# Patient Record
Sex: Male | Born: 1960 | Race: White | Hispanic: No | Marital: Single | Smoking: Former smoker
Health system: Southern US, Community
[De-identification: ages and names within clinical notes are randomized; demographics above are authoritative.]

## PROBLEM LIST (undated history)

## (undated) DIAGNOSIS — L4050 Arthropathic psoriasis, unspecified: Secondary | ICD-10-CM

## (undated) DIAGNOSIS — I10 Essential (primary) hypertension: Secondary | ICD-10-CM

## (undated) DIAGNOSIS — G894 Chronic pain syndrome: Secondary | ICD-10-CM

## (undated) HISTORY — PX: BACK SURGERY: SHX140

## (undated) HISTORY — PX: HAND SURGERY: SHX662

## (undated) HISTORY — PX: APPENDECTOMY: SHX54

---

## 2012-07-01 DIAGNOSIS — L4050 Arthropathic psoriasis, unspecified: Secondary | ICD-10-CM | POA: Insufficient documentation

## 2020-11-12 ENCOUNTER — Encounter (HOSPITAL_COMMUNITY): Payer: Self-pay | Admitting: Internal Medicine

## 2020-11-12 ENCOUNTER — Observation Stay (HOSPITAL_COMMUNITY)
Admission: EM | Admit: 2020-11-12 | Discharge: 2020-11-14 | Disposition: A | Discharge status: Home / Self Care | Payer: Medicare Other | Attending: Obstetrics and Gynecology | Admitting: Obstetrics and Gynecology

## 2020-11-12 ENCOUNTER — Observation Stay (HOSPITAL_COMMUNITY): Payer: Medicare Other

## 2020-11-12 ENCOUNTER — Other Ambulatory Visit: Payer: Self-pay

## 2020-11-12 ENCOUNTER — Emergency Department (HOSPITAL_COMMUNITY): Payer: Medicare Other

## 2020-11-12 DIAGNOSIS — Z87891 Personal history of nicotine dependence: Secondary | ICD-10-CM | POA: Diagnosis not present

## 2020-11-12 DIAGNOSIS — R29810 Facial weakness: Secondary | ICD-10-CM

## 2020-11-12 DIAGNOSIS — R202 Paresthesia of skin: Secondary | ICD-10-CM | POA: Diagnosis not present

## 2020-11-12 DIAGNOSIS — I10 Essential (primary) hypertension: Secondary | ICD-10-CM | POA: Diagnosis not present

## 2020-11-12 DIAGNOSIS — Z20822 Contact with and (suspected) exposure to covid-19: Secondary | ICD-10-CM | POA: Insufficient documentation

## 2020-11-12 DIAGNOSIS — G459 Transient cerebral ischemic attack, unspecified: Secondary | ICD-10-CM

## 2020-11-12 DIAGNOSIS — Z79899 Other long term (current) drug therapy: Secondary | ICD-10-CM | POA: Diagnosis not present

## 2020-11-12 DIAGNOSIS — I639 Cerebral infarction, unspecified: Secondary | ICD-10-CM | POA: Diagnosis not present

## 2020-11-12 DIAGNOSIS — R030 Elevated blood-pressure reading, without diagnosis of hypertension: Secondary | ICD-10-CM

## 2020-11-12 HISTORY — DX: Chronic pain syndrome: G89.4

## 2020-11-12 HISTORY — DX: Arthropathic psoriasis, unspecified: L40.50

## 2020-11-12 HISTORY — DX: Essential (primary) hypertension: I10

## 2020-11-12 LAB — CBG MONITORING, ED: Glucose-Capillary: 121 mg/dL — ABNORMAL HIGH (ref 70–99)

## 2020-11-12 LAB — I-STAT CHEM 8, ED
BUN: 6 mg/dL — ABNORMAL LOW (ref 8–23)
Calcium, Ion: 1.03 mmol/L — ABNORMAL LOW (ref 1.15–1.40)
Chloride: 104 mmol/L (ref 98–111)
Creatinine, Ser: 0.8 mg/dL (ref 0.61–1.24)
Glucose, Bld: 130 mg/dL — ABNORMAL HIGH (ref 70–99)
HCT: 38 % — ABNORMAL LOW (ref 39.0–52.0)
Hemoglobin: 12.9 g/dL — ABNORMAL LOW (ref 13.0–17.0)
Potassium: 3.8 mmol/L (ref 3.5–5.1)
Sodium: 136 mmol/L (ref 135–145)
TCO2: 22 mmol/L (ref 22–32)

## 2020-11-12 LAB — CBC
HCT: 39.3 % (ref 39.0–52.0)
Hemoglobin: 12.5 g/dL — ABNORMAL LOW (ref 13.0–17.0)
MCH: 27 pg (ref 26.0–34.0)
MCHC: 31.8 g/dL (ref 30.0–36.0)
MCV: 84.9 fL (ref 80.0–100.0)
Platelets: 348 10*3/uL (ref 150–400)
RBC: 4.63 MIL/uL (ref 4.22–5.81)
RDW: 15.8 % — ABNORMAL HIGH (ref 11.5–15.5)
WBC: 9.1 10*3/uL (ref 4.0–10.5)
nRBC: 0 % (ref 0.0–0.2)

## 2020-11-12 LAB — COMPREHENSIVE METABOLIC PANEL
ALT: 21 U/L (ref 0–44)
AST: 25 U/L (ref 15–41)
Albumin: 3.5 g/dL (ref 3.5–5.0)
Alkaline Phosphatase: 83 U/L (ref 38–126)
Anion gap: 11 (ref 5–15)
BUN: 5 mg/dL — ABNORMAL LOW (ref 8–23)
CO2: 21 mmol/L — ABNORMAL LOW (ref 22–32)
Calcium: 8.7 mg/dL — ABNORMAL LOW (ref 8.9–10.3)
Chloride: 102 mmol/L (ref 98–111)
Creatinine, Ser: 0.89 mg/dL (ref 0.61–1.24)
GFR, Estimated: >60 mL/min (ref >60)
Glucose, Bld: 128 mg/dL — ABNORMAL HIGH (ref 70–99)
Potassium: 3.7 mmol/L (ref 3.5–5.1)
Sodium: 134 mmol/L — ABNORMAL LOW (ref 135–145)
Total Bilirubin: 0.3 mg/dL (ref 0.3–1.2)
Total Protein: 7.8 g/dL (ref 6.5–8.1)

## 2020-11-12 LAB — APTT: aPTT: 28 seconds (ref 24–36)

## 2020-11-12 LAB — DIFFERENTIAL
Abs Immature Granulocytes: 0.04 10*3/uL (ref 0.00–0.07)
Basophils Absolute: 0.1 10*3/uL (ref 0.0–0.1)
Basophils Relative: 1 %
Eosinophils Absolute: 0.2 10*3/uL (ref 0.0–0.5)
Eosinophils Relative: 2 %
Immature Granulocytes: 0 %
Lymphocytes Relative: 33 %
Lymphs Abs: 3 10*3/uL (ref 0.7–4.0)
Monocytes Absolute: 0.5 10*3/uL (ref 0.1–1.0)
Monocytes Relative: 5 %
Neutro Abs: 5.3 10*3/uL (ref 1.7–7.7)
Neutrophils Relative %: 59 %

## 2020-11-12 LAB — PROTIME-INR
INR: 1 (ref 0.8–1.2)
Prothrombin Time: 13.1 seconds (ref 11.4–15.2)

## 2020-11-12 IMAGING — CT CT HEAD CODE STROKE
3 series · 16 of 37 positions shown, 18 images · non-contrast
Comparison: None.

CLINICAL DATA: Code stroke.  Left facial droop and numbness

EXAM:
CT HEAD WITHOUT CONTRAST
TECHNIQUE: Contiguous axial images were obtained from the base of the skull
through the vertex without intravenous contrast.

[Series 3: head without · axial · non-contrast · 0.46mm/px · z∈[+1274,+1404]mm · 7 of 36 slices shown, 9 images]
[im 5/36  brain]
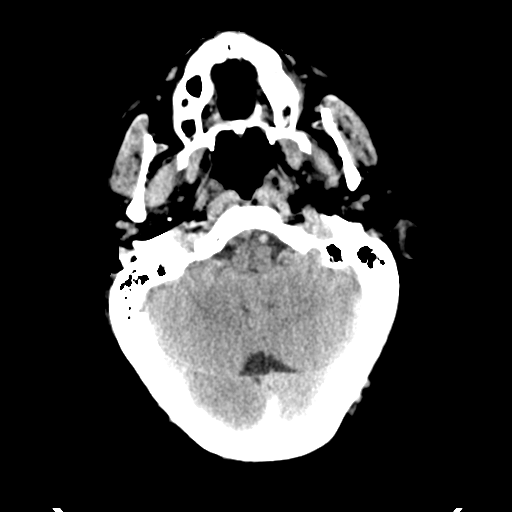
[im 5/36  bone]
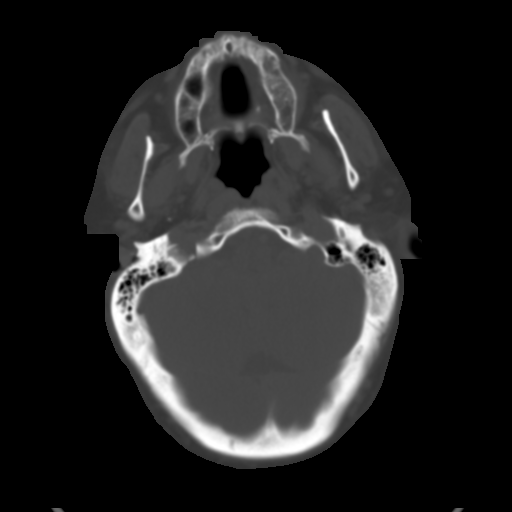
[im 9/36  brain]
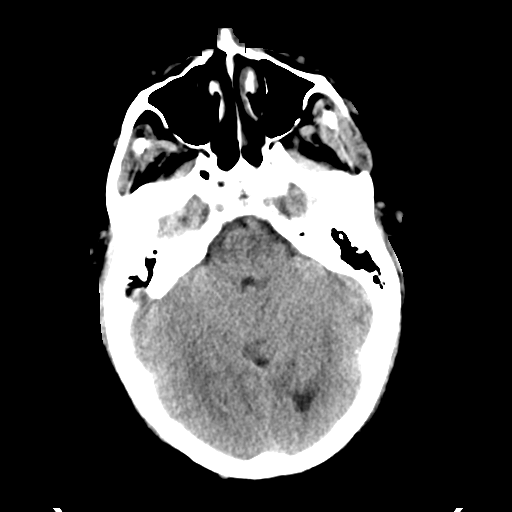
[im 14/36  brain]
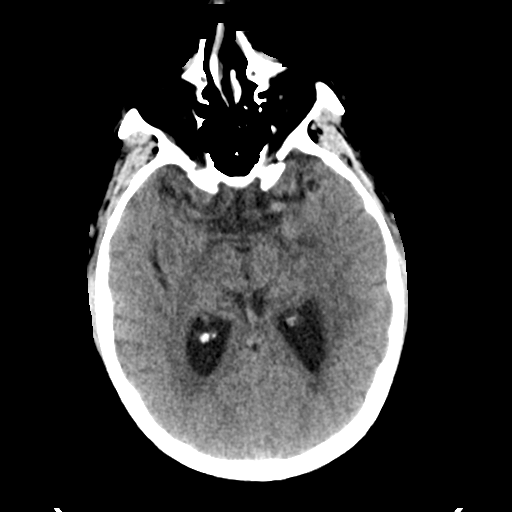
[im 18/36  brain]
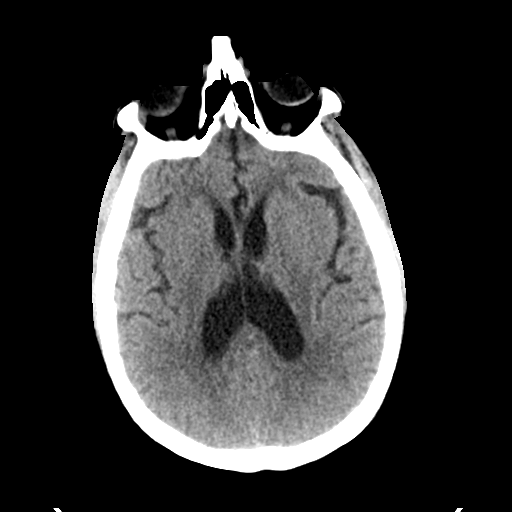
[im 22/36  brain]
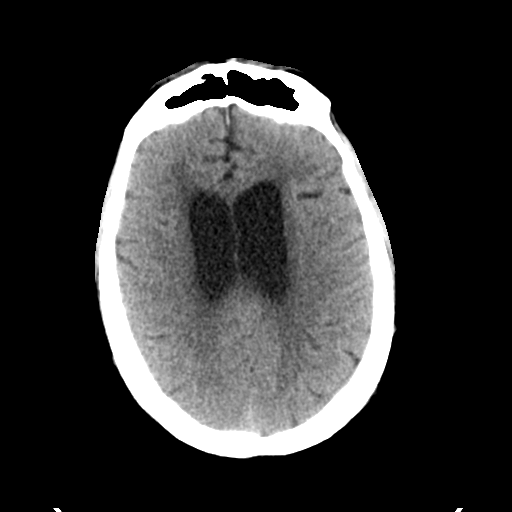
[im 22/36  bone]
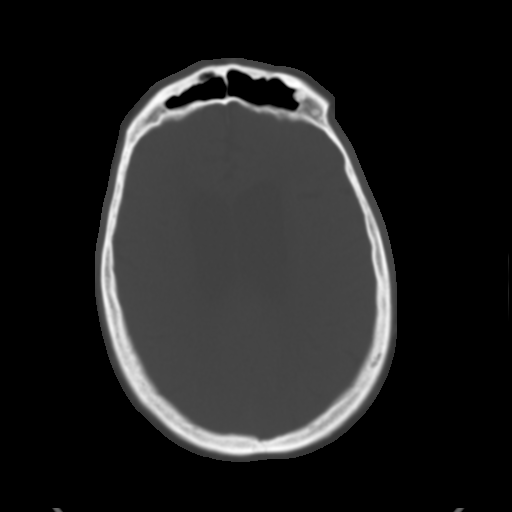
[im 27/36  brain]
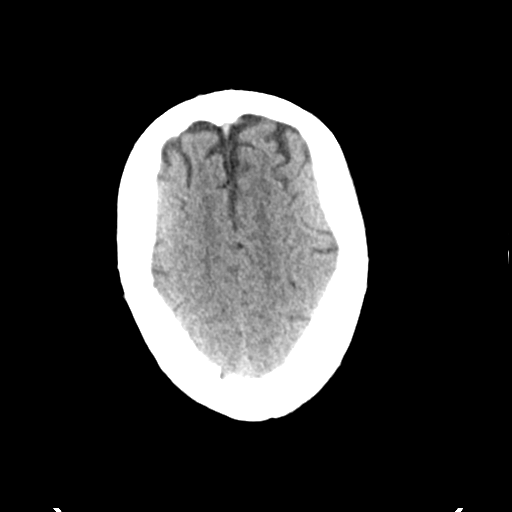
[im 31/36  brain]
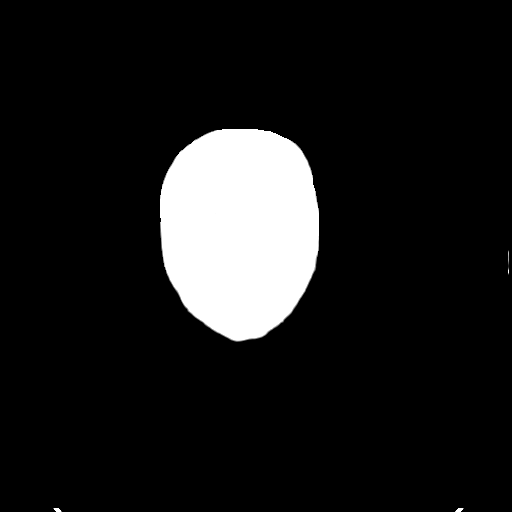

[Series 4: head bone · axial · 0.46mm/px · z∈[+1270,+1378]mm · 6 of 90 slices shown]
[im 9/90  bone]
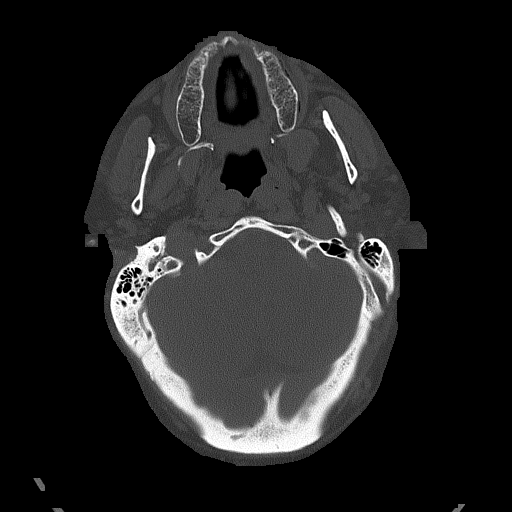
[im 18/90  bone]
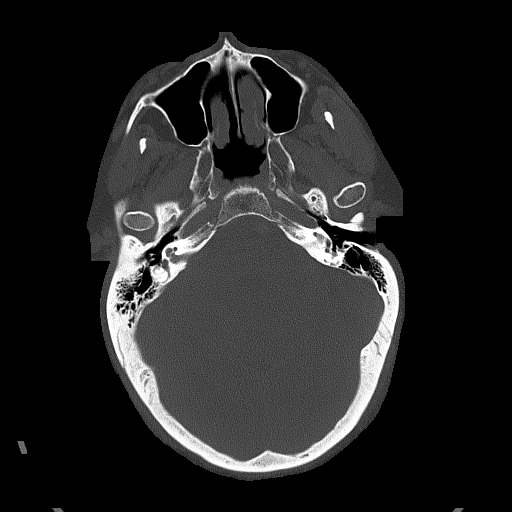
[im 27/90  bone]
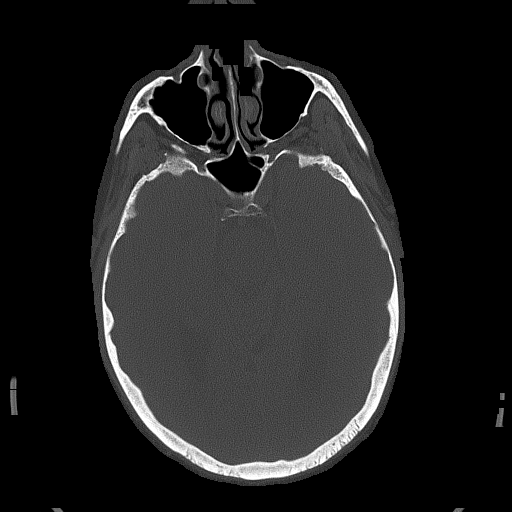
[im 41/90  bone]
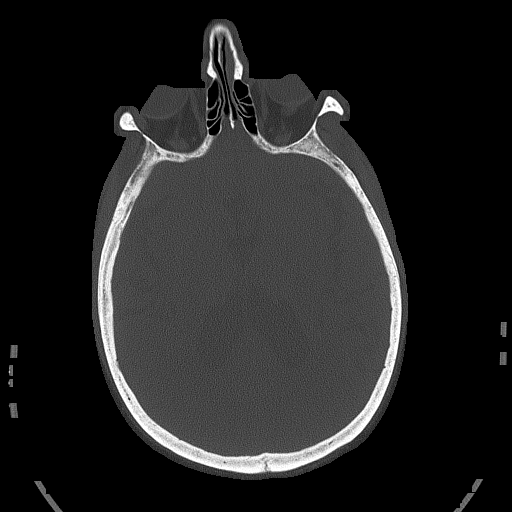
[im 49/90  bone]
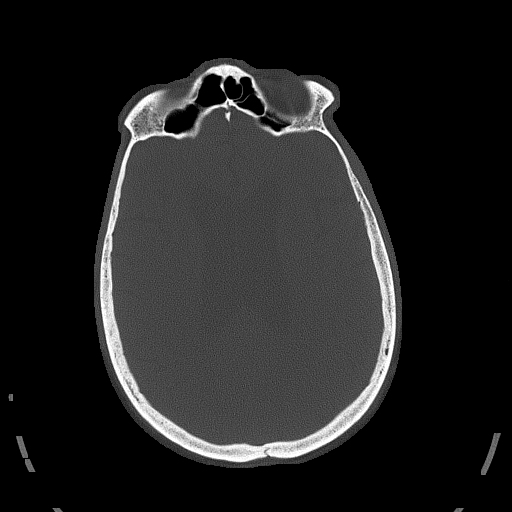
[im 63/90  bone]
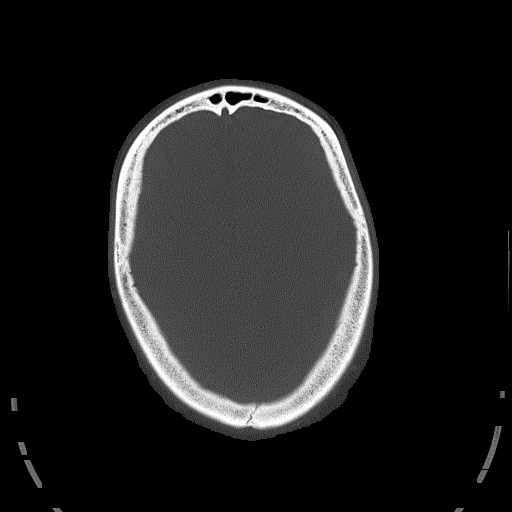

[Series 6: head without sag · sagittal · non-contrast · 0.35mm/px · 3 of 67 slices shown]
[im 23/67  brain]
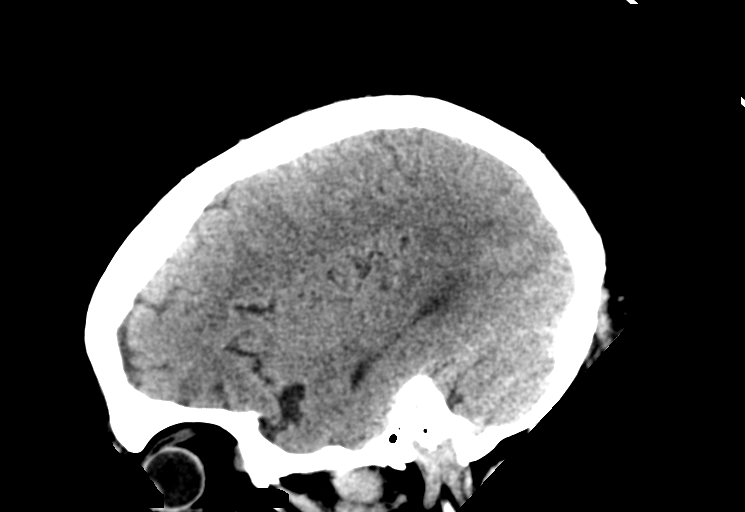
[im 34/67  brain]
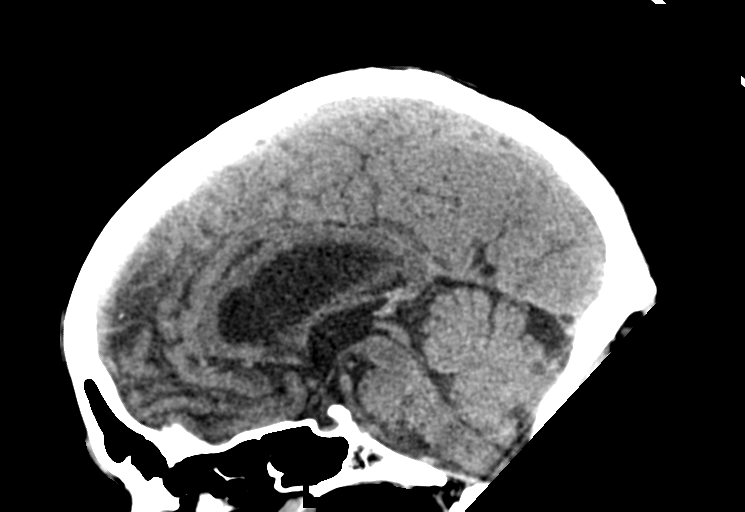
[im 45/67  brain]
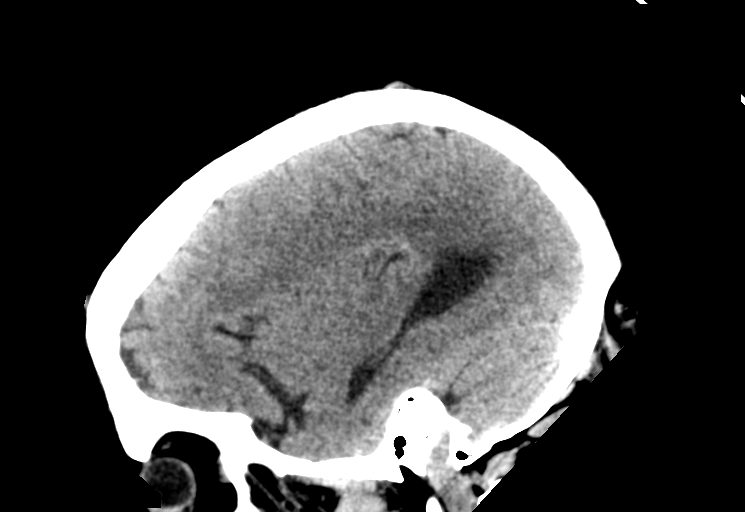

[16 of 37 positions shown; findings below may reference images not displayed]

FINDINGS: Brain: There is no acute intracranial hemorrhage, mass effect, or
edema. Gray-white differentiation is preserved. Patchy
hypoattenuation in the supratentorial white matter is nonspecific
but may reflect mild to moderate chronic microvascular ischemic
changes. There is disproportionate prominence of the ventricles
compared to the sulci. No extra-axial fluid collection.

Vascular: No hyperdense vessel.

Skull: Unremarkable.

Sinuses/Orbits: No acute abnormality.

Other: Head mastoid air cells are clear.

ASPECTS (Alberta Stroke Program Early CT Score)

- Ganglionic level infarction (caudate, lentiform nuclei, internal
capsule, insula, M1-M3 cortex): 7

- Supraganglionic infarction (M4-M6 cortex): 3

Total score (0-10 with 10 being normal): 10
IMPRESSION: There is no acute intracranial hemorrhage or evidence of acute
infarction. ASPECT score is 10.

Disproportionate prominence of the ventricles. May reflect central
cerebral volume loss or communicating (normal pressure)
hydrocephalus in the appropriate setting.

Mild to moderate chronic microvascular ischemic changes.

Preliminary results were communicated to [REDACTED] at [DATE] on
[DATE] by text page via the AM[employer] messaging system.

## 2020-11-12 MED ORDER — PANTOPRAZOLE SODIUM 20 MG PO TBEC
20.0000 mg | DELAYED_RELEASE_TABLET | Freq: Every day | ORAL | Status: DC
  Administered 2020-11-13 – 2020-11-14 (×2): 20 mg via ORAL
  Filled 2020-11-12 (×2): qty 1

## 2020-11-12 MED ORDER — ENOXAPARIN SODIUM 40 MG/0.4ML IJ SOSY
40.0000 mg | PREFILLED_SYRINGE | INTRAMUSCULAR | Status: DC
  Administered 2020-11-13 (×2): 40 mg via SUBCUTANEOUS
  Filled 2020-11-12 (×2): qty 0.4

## 2020-11-12 MED ORDER — ACETAMINOPHEN 650 MG RE SUPP: 650.0000 mg | RECTAL | Status: DC | PRN

## 2020-11-12 MED ORDER — ACETAMINOPHEN 160 MG/5ML PO SOLN: 650.0000 mg | ORAL | Status: DC | PRN

## 2020-11-12 MED ORDER — HYDROCORTISONE 1 % EX CREA
1.0000 "application " | TOPICAL_CREAM | Freq: Every day | CUTANEOUS | Status: DC
  Administered 2020-11-14: 1 via TOPICAL
  Filled 2020-11-12: qty 28

## 2020-11-12 MED ORDER — LORAZEPAM 2 MG/ML IJ SOLN: 1.0000 mg | Freq: Once | INTRAMUSCULAR | Status: DC

## 2020-11-12 MED ORDER — ALBUTEROL SULFATE (2.5 MG/3ML) 0.083% IN NEBU: 3.0000 mL | INHALATION_SOLUTION | RESPIRATORY_TRACT | Status: DC | PRN

## 2020-11-12 MED ORDER — STROKE: EARLY STAGES OF RECOVERY BOOK
Freq: Once | Status: AC
  Administered 2020-11-12: 1

## 2020-11-12 MED ORDER — SODIUM CHLORIDE 0.9% FLUSH: 3.0000 mL | Freq: Once | INTRAVENOUS | Status: DC

## 2020-11-12 MED ORDER — ACETAMINOPHEN 500 MG PO TABS
1000.0000 mg | ORAL_TABLET | Freq: Four times a day (QID) | ORAL | Status: DC | PRN
  Administered 2020-11-13 – 2020-11-14 (×3): 1000 mg via ORAL
  Filled 2020-11-12 (×3): qty 2

## 2020-11-12 MED ORDER — ACETAMINOPHEN 325 MG PO TABS: 650.0000 mg | ORAL_TABLET | ORAL | Status: DC | PRN

## 2020-11-12 MED ORDER — CLOPIDOGREL BISULFATE 75 MG PO TABS
75.0000 mg | ORAL_TABLET | Freq: Every day | ORAL | Status: DC
  Administered 2020-11-13 – 2020-11-14 (×2): 75 mg via ORAL
  Filled 2020-11-12 (×2): qty 1

## 2020-11-12 NOTE — H&P (Signed)
History and Physical    Richard Vaughan:[id number] DOB: January 08, 1961 DOA: 11/12/2020  PCP: No primary care provider on file.  Patient coming from: Home  I have personally briefly reviewed patient's old medical records in [facility]  Chief Complaint: L sided numbness / weakness  HPI: Richard Vaughan is a 60 y.o. male with medical history significant of HTN, psoriasis / psoriatic arthritis.  Pt presents to ED for L sided numbness / weakness onset at 1600 today.  Located in face, arm, and leg on L side.  Thinks it onset this afternoon but not 100% sure.  No fevers, chills.   ED Course: CT head neg for acute findings.  Hospitalist asked to admit.   Review of Systems: As per HPI, otherwise all review of systems negative.  Past Medical History:  Diagnosis Date   Arthropathic psoriasis (HCC)    Chronic pain syndrome    HTN (hypertension)     Past Surgical History:  Procedure Laterality Date   APPENDECTOMY     BACK SURGERY     HAND SURGERY Right      reports that he has quit smoking. His smoking use included cigarettes. He has never used smokeless tobacco. He reports current drug use. Drug: Marijuana. He reports that he does not drink alcohol.  Allergies  Allergen Reactions   Aspirin Anaphylaxis   Etanercept Anaphylaxis   Nsaids Anaphylaxis   Colchicine Nausea And Vomiting   Doxycycline Nausea And Vomiting, Rash and Other (See Comments)    Causes petechiae   Methotrexate Other (See Comments)    Vomiting, diarrhea, nausea    Morphine Other (See Comments)    Just doesn't work   Sulfamethoxazole-Trimethoprim Swelling    Hand swelling     Family History  Problem Relation Age of Onset   Stroke Mother      Prior to Admission medications   Medication Sig Start Date End Date Taking? Authorizing Provider  acetaminophen (TYLENOL) 500 MG tablet Take 1,000 mg by mouth every 6 (six) hours as needed for headache (pain).   Yes [provider]  hydrocortisone 2.5  % cream Apply 1 application topically daily. 08/16/15  Yes [provider]  lansoprazole (PREVACID) 15 MG capsule Take 30 mg by mouth daily before breakfast.   Yes [provider]  albuterol (VENTOLIN HFA) 108 (90 Base) MCG/ACT inhaler Inhale 2 puffs into the lungs every 4 (four) hours as needed. Patient not taking: No sig reported 07/29/18   [provider]    Physical Exam: Vitals:   11/12/20 1830 11/12/20 1831 11/12/20 1930  BP: (!) 175/106  (!) 170/104  Pulse: 90  91  Resp: 14  16  Temp: 98.5 F (36.9 C)    SpO2: 97%  98%  Weight:  100.6 kg     Constitutional: NAD, calm, comfortable Eyes: PERRL, lids and conjunctivae normal ENMT: Mucous membranes are moist. Posterior pharynx clear of any exudate or lesions.Normal dentition.  Neck: normal, supple, no masses, no thyromegaly Respiratory: clear to auscultation bilaterally, no wheezing, no crackles. Normal respiratory effort. No accessory muscle use.  Cardiovascular: Regular rate and rhythm, no murmurs / rubs / gallops. No extremity edema. 2+ pedal pulses. No carotid bruits.  Abdomen: no tenderness, no masses palpated. No hepatosplenomegaly. Bowel sounds positive.  Musculoskeletal: no clubbing / cyanosis. No joint deformity upper and lower extremities. Good ROM, no contractures. Normal muscle tone.  Skin: no rashes, lesions, ulcers. No induration Neurologic: 4+/5 grip on L, pain limited in BLE Psychiatric: Normal  judgment and insight. Alert and oriented x 3. Normal mood.    Labs on Admission: I have personally reviewed following labs and imaging studies  CBC: Recent Labs  Lab 11/12/20 1837 11/12/20 1850  WBC 9.1  --   NEUTROABS 5.3  --   HGB 12.5* 12.9*  HCT 39.3 38.0*  MCV 84.9  --   PLT 348  --    Basic Metabolic Panel: Recent Labs  Lab 11/12/20 1837 11/12/20 1850  NA 134* 136  K 3.7 3.8  CL 102 104  CO2 21*  --   GLUCOSE 128* 130*  BUN 5* 6*  CREATININE 0.89 0.80  CALCIUM 8.7*  --     GFR: CrCl cannot be calculated (Unknown ideal weight.). Liver Function Tests: Recent Labs  Lab 11/12/20 1837  AST 25  ALT 21  ALKPHOS 83  BILITOT 0.3  PROT 7.8  ALBUMIN 3.5   No results for input(s): LIPASE, AMYLASE in the last 168 hours. No results for input(s): AMMONIA in the last 168 hours. Coagulation Profile: Recent Labs  Lab 11/12/20 1837  INR 1.0   Cardiac Enzymes: No results for input(s): CKTOTAL, CKMB, CKMBINDEX, TROPONINI in the last 168 hours. BNP (last 3 results) No results for input(s): PROBNP in the last 8760 hours. HbA1C: No results for input(s): HGBA1C in the last 72 hours. CBG: Recent Labs  Lab 11/12/20 1832  GLUCAP 121*   Lipid Profile: No results for input(s): CHOL, HDL, LDLCALC, TRIG, CHOLHDL, LDLDIRECT in the last 72 hours. Thyroid Function Tests: No results for input(s): TSH, T4TOTAL, FREET4, T3FREE, THYROIDAB in the last 72 hours. Anemia Panel: No results for input(s): VITAMINB12, FOLATE, FERRITIN, TIBC, IRON, RETICCTPCT in the last 72 hours. Urine analysis: No results found for: COLORURINE, APPEARANCEUR, LABSPEC, PHURINE, GLUCOSEU, HGBUR, BILIRUBINUR, KETONESUR, PROTEINUR, UROBILINOGEN, NITRITE, LEUKOCYTESUR  Radiological Exams on Admission: CT HEAD CODE STROKE WO CONTRAST  Result Date: 11/12/2020 CLINICAL DATA:  Code stroke.  Left facial droop and numbness EXAM: CT HEAD WITHOUT CONTRAST TECHNIQUE: Contiguous axial images were obtained from the base of the skull through the vertex without intravenous contrast. COMPARISON:  None. FINDINGS: Brain: There is no acute intracranial hemorrhage, mass effect, or edema. Gray-white differentiation is preserved. Patchy hypoattenuation in the supratentorial white matter is nonspecific but may reflect mild to moderate chronic microvascular ischemic changes. There is disproportionate prominence of the ventricles compared to the sulci. No extra-axial fluid collection. Vascular: No hyperdense vessel. Skull:  Unremarkable. Sinuses/Orbits: No acute abnormality. Other: Head mastoid air cells are clear. ASPECTS (Alberta Stroke Program Early CT Score) - Ganglionic level infarction (caudate, lentiform nuclei, internal capsule, insula, M1-M3 cortex): 7 - Supraganglionic infarction (M4-M6 cortex): 3 Total score (0-10 with 10 being normal): 10 IMPRESSION: There is no acute intracranial hemorrhage or evidence of acute infarction. ASPECT score is 10. Disproportionate prominence of the ventricles. May reflect central cerebral volume loss or communicating (normal pressure) hydrocephalus in the appropriate setting. Mild to moderate chronic microvascular ischemic changes. Preliminary results were communicated to Dr. Selina Cooley at 6:46 pm on 11/12/2020 by text page via the [facility] messaging system. Electronically Signed   By: Guadlupe Spanish M.D.   On: 11/12/2020 18:48    EKG: Independently reviewed.  Assessment/Plan Principal Problem:   Acute ischemic stroke (HCC) Active Problems:   HTN (hypertension)    Acute ischemic stroke - Stroke pathway Neuro consult MRI / MRA 2d echo A1C, FLP Plavix 75 PT/OT/SLP Tele monitor HTN - Hold home BP meds (actually not on any) and allow permissive  HTN  DVT prophylaxis: Lovenox Code Status: Full Family Communication: No family in room Disposition Plan: Home after stroke work up [employer] called: Neuro Admission status: Place in [place], Keymarion Bearman M. DO [facility]  How to contact the [facility] Attending or Consulting provider 7A - 7P or covering provider during after hours 7P -7A, for this patient?  Check the care team in [facility] and look for a) attending/consulting [facility] provider listed and b) the [facility] team listed Log into [email or web address]  [employer] Physician Scheduling and messaging for groups and whole hospitals  On call and physician scheduling software for group practices, residents, hospitalists and other medical providers for call, clinic, rotation and shift schedules. OnCall  [employer] is a hospital-wide system for scheduling doctors and paging doctors on call. EasyPlot is for scientific plotting and data analysis.  [email or web address]  and use [place]'s universal password to access. If you do not have the password, please contact the hospital operator.  Locate the [facility] provider you are looking for under [facility] and page to a number that you can be directly reached. If you still have difficulty reaching the provider, please page the [facility] (Director on Call) for the Hospitalists listed on amion for assistance.  11/12/2020, 8:27 PM

## 2020-11-12 NOTE — ED Provider Notes (Signed)
[facility] EMERGENCY DEPARTMENT Provider Note   CSN: [id number] Arrival date & time: 11/12/20  1824     History Chief Complaint  Patient presents with   Weakness    Richard Vaughan is a 60 y.o. male.  Patient with acute onset left sided facial droop and left sided numbness around 1600 today. Symptoms acute onset, moderate, constant, persistent. No headache. No neck pain. No fevers. Denies change in vision or speech. Denies chest pain or sob. No nv.   The history is provided by the patient.  Weakness Associated symptoms: no abdominal pain, no chest pain, no fever, no headaches, no shortness of breath and no vomiting       Past Medical History:  Diagnosis Date   Arthropathic psoriasis (HCC)    Chronic pain syndrome    HTN (hypertension)     Patient Active Problem List   Diagnosis Date Noted   Acute ischemic stroke (HCC) 11/12/2020   HTN (hypertension) 11/12/2020    Past Surgical History:  Procedure Laterality Date   APPENDECTOMY     BACK SURGERY     HAND SURGERY Right        Family History  Problem Relation Age of Onset   Stroke Mother     Social History   Tobacco Use   Smoking status: Former    Pack years: 0.00    Types: Cigarettes   Smokeless tobacco: Never  Substance Use Topics   Alcohol use: Never   Drug use: Yes    Types: Marijuana    Home Medications Prior to Admission medications   Medication Sig Start Date End Date Taking? Authorizing Provider  acetaminophen (TYLENOL) 500 MG tablet Take 1,000 mg by mouth every 6 (six) hours as needed for headache (pain).   Yes [provider]  hydrocortisone 2.5 % cream Apply 1 application topically daily. 08/16/15  Yes [provider]  lansoprazole (PREVACID) 15 MG capsule Take 30 mg by mouth daily before breakfast.   Yes [provider]  albuterol (VENTOLIN HFA) 108 (90 Base) MCG/ACT inhaler Inhale 2 puffs into the lungs every 4 (four) hours as needed. Patient not  taking: No sig reported 07/29/18   [provider]    Allergies    Aspirin, Etanercept, Nsaids, Colchicine, Doxycycline, Methotrexate, Morphine, and Sulfamethoxazole-trimethoprim  Review of Systems   Review of Systems  Constitutional:  Negative for fever.  HENT:  Negative for trouble swallowing.   Eyes:  Negative for visual disturbance.  Respiratory:  Negative for shortness of breath.   Cardiovascular:  Negative for chest pain.  Gastrointestinal:  Negative for abdominal pain and vomiting.  Genitourinary:  Negative for flank pain.  Musculoskeletal:  Negative for back pain and neck pain.  Skin:  Negative for rash.  Neurological:  Positive for numbness. Negative for speech difficulty and headaches.  Hematological:  Does not bruise/bleed easily.  Psychiatric/Behavioral:  Negative for confusion.    Physical Exam Updated Vital Signs BP (!) 178/85 (BP Location: Right Arm)   Pulse 72   Temp 97.9 F (36.6 C) (Oral)   Resp 20   Wt 100.6 kg   SpO2 98%   Physical Exam Vitals and nursing note reviewed.  Constitutional:      Appearance: Normal appearance. He is well-developed.  HENT:     Head: Atraumatic.     Nose: Nose normal.     Mouth/Throat:     Mouth: Mucous membranes are moist.     Pharynx: Oropharynx is clear.  Eyes:  General: No scleral icterus.    Conjunctiva/sclera: Conjunctivae normal.     Pupils: Pupils are equal, round, and reactive to light.  Neck:     Vascular: No carotid bruit.     Trachea: No tracheal deviation.  Cardiovascular:     Rate and Rhythm: Normal rate and regular rhythm.     Pulses: Normal pulses.     Heart sounds: Normal heart sounds. No murmur heard.   No friction rub. No gallop.  Pulmonary:     Effort: Pulmonary effort is normal. No accessory muscle usage or respiratory distress.     Breath sounds: Normal breath sounds.  Abdominal:     General: Bowel sounds are normal. There is no distension.     Palpations: Abdomen is soft.      Tenderness: There is no abdominal tenderness. There is no guarding.  Genitourinary:    Comments: No cva tenderness. Musculoskeletal:        General: No swelling.     Cervical back: Normal range of motion and neck supple. No rigidity.  Skin:    General: Skin is warm and dry.     Findings: No rash.  Neurological:     Mental Status: He is alert.     Comments: Alert, speech clear. No gross dysarthria or aphasia. Motor intact bil, stre 5/5. ?mild left facial droop.   Psychiatric:        Mood and Affect: Mood normal.    ED Results / Procedures / Treatments   Labs (all labs ordered are listed, but only abnormal results are displayed) Results for orders placed or performed during the hospital encounter of 11/12/20  SARS CORONAVIRUS 2 (TAT 6-24 HRS) Nasopharyngeal Nasopharyngeal Swab   Specimen: Nasopharyngeal Swab  Result Value Ref Range   SARS Coronavirus 2 NEGATIVE NEGATIVE  Protime-INR  Result Value Ref Range   Prothrombin Time 13.1 11.4 - 15.2 seconds   INR 1.0 0.8 - 1.2  APTT  Result Value Ref Range   aPTT 28 24 - 36 seconds  CBC  Result Value Ref Range   WBC 9.1 4.0 - 10.5 K/uL   RBC 4.63 4.22 - 5.81 MIL/uL   Hemoglobin 12.5 (L) 13.0 - 17.0 g/dL   HCT 16.1 09.6 - 04.5 %   MCV 84.9 80.0 - 100.0 fL   MCH 27.0 26.0 - 34.0 pg   MCHC 31.8 30.0 - 36.0 g/dL   RDW 40.9 (H) 81.1 - 91.4 %   Platelets 348 150 - 400 K/uL   nRBC 0.0 0.0 - 0.2 %  Differential  Result Value Ref Range   Neutrophils Relative % 59 %   Neutro Abs 5.3 1.7 - 7.7 K/uL   Lymphocytes Relative 33 %   Lymphs Abs 3.0 0.7 - 4.0 K/uL   Monocytes Relative 5 %   Monocytes Absolute 0.5 0.1 - 1.0 K/uL   Eosinophils Relative 2 %   Eosinophils Absolute 0.2 0.0 - 0.5 K/uL   Basophils Relative 1 %   Basophils Absolute 0.1 0.0 - 0.1 K/uL   Immature Granulocytes 0 %   Abs Immature Granulocytes 0.04 0.00 - 0.07 K/uL  Comprehensive metabolic panel  Result Value Ref Range   Sodium 134 (L) 135 - 145 mmol/L   Potassium  3.7 3.5 - 5.1 mmol/L   Chloride 102 98 - 111 mmol/L   CO2 21 (L) 22 - 32 mmol/L   Glucose, Bld 128 (H) 70 - 99 mg/dL   BUN 5 (L) 8 - 23 mg/dL  Creatinine, Ser 0.89 0.61 - 1.24 mg/dL   Calcium 8.7 (L) 8.9 - 10.3 mg/dL   Total Protein 7.8 6.5 - 8.1 g/dL   Albumin 3.5 3.5 - 5.0 g/dL   AST 25 15 - 41 U/L   ALT 21 0 - 44 U/L   Alkaline Phosphatase 83 38 - 126 U/L   Total Bilirubin 0.3 0.3 - 1.2 mg/dL   GFR, Estimated >16 >10 mL/min   Anion gap 11 5 - 15  HIV Antibody (routine testing w rflx)  Result Value Ref Range   HIV Screen 4th Generation wRfx Non Reactive Non Reactive  Lipid panel  Result Value Ref Range   Cholesterol 169 0 - 200 mg/dL   Triglycerides 960 (H) <150 mg/dL   HDL 22 (L) >45 mg/dL   Total CHOL/HDL Ratio 7.7 RATIO   VLDL UNABLE TO CALCULATE IF TRIGLYCERIDE OVER 400 mg/dL 0 - 40 mg/dL   LDL Cholesterol UNABLE TO CALCULATE IF TRIGLYCERIDE OVER 400 mg/dL 0 - 99 mg/dL  LDL cholesterol, direct  Result Value Ref Range   Direct LDL 60.3 0 - 99 mg/dL  CBG monitoring, ED  Result Value Ref Range   Glucose-Capillary 121 (H) 70 - 99 mg/dL   Comment 1 Notify RN    Comment 2 Document in Chart   I-stat chem 8, ED  Result Value Ref Range   Sodium 136 135 - 145 mmol/L   Potassium 3.8 3.5 - 5.1 mmol/L   Chloride 104 98 - 111 mmol/L   BUN 6 (L) 8 - 23 mg/dL   Creatinine, Ser 4.09 0.61 - 1.24 mg/dL   Glucose, Bld 811 (H) 70 - 99 mg/dL   Calcium, Ion 9.14 (L) 1.15 - 1.40 mmol/L   TCO2 22 22 - 32 mmol/L   Hemoglobin 12.9 (L) 13.0 - 17.0 g/dL   HCT 78.2 (L) 95.6 - 21.3 %  ECHOCARDIOGRAM COMPLETE  Result Value Ref Range   Weight 3,548.52 oz   BP 178/85 mmHg   S' Lateral 3.40 cm   AR max vel 2.17 cm2   AV Area VTI 2.16 cm2   AV Mean grad 4.0 mmHg   AV Peak grad 7.5 mmHg   Ao pk vel 1.37 m/s   Area-P 1/2 2.62 cm2   AV Area mean vel 2.19 cm2   MV VTI 1.76 cm2   MR ANGIO HEAD WO CONTRAST  Result Date: 11/13/2020 CLINICAL DATA:  Initial evaluation for acute neuro deficit,  stroke suspected. EXAM: MRI HEAD WITHOUT CONTRAST MRA HEAD WITHOUT CONTRAST TECHNIQUE: Multiplanar, multi-echo pulse sequences of the brain and surrounding structures were acquired without intravenous contrast. Angiographic images of the Circle of Willis were acquired using MRA technique without intravenous contrast. COMPARISON: No pertinent prior exam. COMPARISON:  Prior head CT from 11/12/2020. FINDINGS: MRI HEAD FINDINGS Brain: Cerebral volume within normal limits for age. Patchy T2/FLAIR hyperintensity involving the periventricular deep white matter both cerebral hemispheres could reflect chronic microvascular ischemic disease or possibly transependymal flow of CSF (or a combination there of). No abnormal foci of restricted diffusion to suggest acute or subacute ischemia. Gray-white matter differentiation maintained. No encephalomalacia suggest chronic cortical infarction. No evidence for acute or chronic intracranial hemorrhage. No mass lesion, midline shift or mass effect. Ventricles are somewhat prominent in at upper portion of cortical sulcation. While this finding could be related to atrophy, possible in pH could also have this appearance. Pituitary gland and suprasellar region within normal limits. Midline structures intact. No extra-axial collection. Vascular: Major intracranial vascular  flow voids are maintained. Skull and upper cervical spine: Craniocervical junction within normal limits. Bone marrow signal intensity normal. No scalp soft tissue abnormality. Sinuses/Orbits: Globes and orbital soft tissues demonstrate no acute finding. Paranasal sinuses are largely clear. No significant mastoid effusion. Inner ear structures grossly normal. Other: None. MRA HEAD FINDINGS Anterior circulation: Visualized distal cervical segments of the internal carotid arteries are patent with antegrade flow. Petrous, cavernous, and supraclinoid segments patent without stenosis or other abnormality. A1 segments patent  bilaterally. Right A1 hypoplastic. Normal anterior communicating artery complex. Anterior cerebral arteries patent to their distal aspects without stenosis. No M1 stenosis or occlusion. Normal MCA bifurcations. Distal MCA branches well perfused and symmetric. Posterior circulation: Visualized portions of the distal V4 segments are widely patent. Neither PICA origin visualized. Basilar patent to its distal aspect without stenosis. Superior cerebellar arteries patent bilaterally. Both PCAs primarily supplied via the basilar well perfused to their distal aspects. No intracranial aneurysm. Anatomic variants: Hypoplastic right A1. IMPRESSION: MRI HEAD IMPRESSION: 1. No acute intracranial abnormality. 2. Prominence of the ventricular system at the upper portion of cortical sulcation. While this finding could be related to atrophy, possible NPH could also have this appearance. 3. Patchy T2/FLAIR hyperintensity involving the periventricular and deep white matter of both cerebral hemispheres. Finding could reflect changes of chronic microvascular ischemic disease or possibly transependymal flow of CSF (or a combination thereof). MRA HEAD IMPRESSION: Normal intracranial MRA. No large vessel occlusion, hemodynamically significant stenosis, or other acute vascular abnormality. Electronically Signed   By: Rise Mu M.D.   On: 11/13/2020 03:17   MR BRAIN WO CONTRAST  Result Date: 11/13/2020 CLINICAL DATA:  Initial evaluation for acute neuro deficit, stroke suspected. EXAM: MRI HEAD WITHOUT CONTRAST MRA HEAD WITHOUT CONTRAST TECHNIQUE: Multiplanar, multi-echo pulse sequences of the brain and surrounding structures were acquired without intravenous contrast. Angiographic images of the Circle of Willis were acquired using MRA technique without intravenous contrast. COMPARISON: No pertinent prior exam. COMPARISON:  Prior head CT from 11/12/2020. FINDINGS: MRI HEAD FINDINGS Brain: Cerebral volume within normal limits  for age. Patchy T2/FLAIR hyperintensity involving the periventricular deep white matter both cerebral hemispheres could reflect chronic microvascular ischemic disease or possibly transependymal flow of CSF (or a combination there of). No abnormal foci of restricted diffusion to suggest acute or subacute ischemia. Gray-white matter differentiation maintained. No encephalomalacia suggest chronic cortical infarction. No evidence for acute or chronic intracranial hemorrhage. No mass lesion, midline shift or mass effect. Ventricles are somewhat prominent in at upper portion of cortical sulcation. While this finding could be related to atrophy, possible in pH could also have this appearance. Pituitary gland and suprasellar region within normal limits. Midline structures intact. No extra-axial collection. Vascular: Major intracranial vascular flow voids are maintained. Skull and upper cervical spine: Craniocervical junction within normal limits. Bone marrow signal intensity normal. No scalp soft tissue abnormality. Sinuses/Orbits: Globes and orbital soft tissues demonstrate no acute finding. Paranasal sinuses are largely clear. No significant mastoid effusion. Inner ear structures grossly normal. Other: None. MRA HEAD FINDINGS Anterior circulation: Visualized distal cervical segments of the internal carotid arteries are patent with antegrade flow. Petrous, cavernous, and supraclinoid segments patent without stenosis or other abnormality. A1 segments patent bilaterally. Right A1 hypoplastic. Normal anterior communicating artery complex. Anterior cerebral arteries patent to their distal aspects without stenosis. No M1 stenosis or occlusion. Normal MCA bifurcations. Distal MCA branches well perfused and symmetric. Posterior circulation: Visualized portions of the distal V4 segments are widely patent. Neither PICA  origin visualized. Basilar patent to its distal aspect without stenosis. Superior cerebellar arteries patent  bilaterally. Both PCAs primarily supplied via the basilar well perfused to their distal aspects. No intracranial aneurysm. Anatomic variants: Hypoplastic right A1. IMPRESSION: MRI HEAD IMPRESSION: 1. No acute intracranial abnormality. 2. Prominence of the ventricular system at the upper portion of cortical sulcation. While this finding could be related to atrophy, possible NPH could also have this appearance. 3. Patchy T2/FLAIR hyperintensity involving the periventricular and deep white matter of both cerebral hemispheres. Finding could reflect changes of chronic microvascular ischemic disease or possibly transependymal flow of CSF (or a combination thereof). MRA HEAD IMPRESSION: Normal intracranial MRA. No large vessel occlusion, hemodynamically significant stenosis, or other acute vascular abnormality. Electronically Signed   By: Rise Mu M.D.   On: 11/13/2020 03:17   ECHOCARDIOGRAM COMPLETE  Result Date: 11/13/2020    ECHOCARDIOGRAM REPORT   Patient Name:   TANIELA FELTUS Date of Exam: 11/13/2020 Medical Rec #:  [id number]    Height: Accession #:    [id number]   Weight:       221.8 lb Date of Birth:  January 01, 1961    BSA:          2.163 m Patient Age:    59 years     BP:           178/85 mmHg Patient Gender: M            HR:           62 bpm. Exam Location:  Inpatient Procedure: 2D Echo, Cardiac Doppler and Color Doppler Indications:    Stroke  History:        Patient has no prior history of Echocardiogram examinations.                 Risk Factors:Hypertension.  Sonographer:    Ross Ludwig RDCS (AE) Referring Phys: [phone] JARED M GARDNER  Sonographer Comments: Suboptimal subcostal window. IMPRESSIONS  1. Left ventricular ejection fraction, by estimation, is 60 to 65%. The left ventricle has normal function. The left ventricle has no regional wall motion abnormalities. There is mild left ventricular hypertrophy. Left ventricular diastolic parameters were normal.  2. Right ventricular systolic function is  normal. The right ventricular size is normal.  3. Left atrial size was mildly dilated.  4. The mitral valve is normal in structure. Trivial mitral valve regurgitation. No evidence of mitral stenosis.  5. The aortic valve is tricuspid. Aortic valve regurgitation is not visualized. No aortic stenosis is present.  6. The inferior vena cava is normal in size with greater than 50% respiratory variability, suggesting right atrial pressure of 3 mmHg. FINDINGS  Left Ventricle: Left ventricular ejection fraction, by estimation, is 60 to 65%. The left ventricle has normal function. The left ventricle has no regional wall motion abnormalities. The left ventricular internal cavity size was normal in size. There is  mild left ventricular hypertrophy. Left ventricular diastolic parameters were normal. Right Ventricle: The right ventricular size is normal. No increase in right ventricular wall thickness. Right ventricular systolic function is normal. Left Atrium: Left atrial size was mildly dilated. Right Atrium: Right atrial size was normal in size. Pericardium: There is no evidence of pericardial effusion. Mitral Valve: The mitral valve is normal in structure. There is mild thickening of the mitral valve leaflet(s). Trivial mitral valve regurgitation. No evidence of mitral valve stenosis. MV peak gradient, 4.1 mmHg. The mean mitral valve gradient is 1.0 mmHg. Tricuspid Valve: The tricuspid  valve is normal in structure. Tricuspid valve regurgitation is trivial. No evidence of tricuspid stenosis. Aortic Valve: The aortic valve is tricuspid. Aortic valve regurgitation is not visualized. No aortic stenosis is present. Aortic valve mean gradient measures 4.0 mmHg. Aortic valve peak gradient measures 7.5 mmHg. Aortic valve area, by VTI measures 2.16 cm. Pulmonic Valve: The pulmonic valve was normal in structure. Pulmonic valve regurgitation is not visualized. No evidence of pulmonic stenosis. Aorta: The aortic root is normal in size  and structure. Venous: The inferior vena cava is normal in size with greater than 50% respiratory variability, suggesting right atrial pressure of 3 mmHg. IAS/Shunts: No atrial level shunt detected by color flow Doppler.  LEFT VENTRICLE PLAX 2D LVIDd:         4.70 cm  Diastology LVIDs:         3.40 cm  LV e' medial:    7.18 cm/s LV PW:         1.10 cm  LV E/e' medial:  10.4 LV IVS:        1.20 cm  LV e' lateral:   8.70 cm/s LVOT diam:     1.90 cm  LV E/e' lateral: 8.6 LV SV:         56 LV SV Index:   26 LVOT Area:     2.84 cm  RIGHT VENTRICLE RV Basal diam:  3.10 cm RV S prime:     14.80 cm/s TAPSE (M-mode): 2.6 cm LEFT ATRIUM             Index       RIGHT ATRIUM           Index LA diam:        4.00 cm 1.85 cm/m  RA Area:     12.20 cm LA Vol (A2C):   80.3 ml 37.12 ml/m RA Volume:   25.00 ml  11.56 ml/m LA Vol (A4C):   52.2 ml 24.13 ml/m LA Biplane Vol: 68.7 ml 31.76 ml/m  AORTIC VALVE AV Area (Vmax):    2.17 cm AV Area (Vmean):   2.19 cm AV Area (VTI):     2.16 cm AV Vmax:           137.00 cm/s AV Vmean:          98.500 cm/s AV VTI:            0.258 m AV Peak Grad:      7.5 mmHg AV Mean Grad:      4.0 mmHg LVOT Vmax:         105.00 cm/s LVOT Vmean:        76.100 cm/s LVOT VTI:          0.197 m LVOT/AV VTI ratio: 0.76  AORTA Ao Root diam: 3.60 cm Ao Asc diam:  3.20 cm MITRAL VALVE MV Area (PHT): 2.62 cm    SHUNTS MV Area VTI:   1.76 cm    Systemic VTI:  0.20 m MV Peak grad:  4.1 mmHg    Systemic Diam: 1.90 cm MV Mean grad:  1.0 mmHg MV Vmax:       1.01 m/s MV Vmean:      54.6 cm/s MV Decel Time: 289 msec MV E velocity: 74.80 cm/s MV A velocity: 71.00 cm/s MV E/A ratio:  1.05 Charlton Haws MD Electronically signed by Charlton Haws MD Signature Date/Time: 11/13/2020/4:27:20 PM    Final    CT HEAD CODE STROKE WO CONTRAST  Result Date: 11/12/2020 CLINICAL  DATA:  Code stroke.  Left facial droop and numbness EXAM: CT HEAD WITHOUT CONTRAST TECHNIQUE: Contiguous axial images were obtained from the base of the  skull through the vertex without intravenous contrast. COMPARISON:  None. FINDINGS: Brain: There is no acute intracranial hemorrhage, mass effect, or edema. Gray-white differentiation is preserved. Patchy hypoattenuation in the supratentorial white matter is nonspecific but may reflect mild to moderate chronic microvascular ischemic changes. There is disproportionate prominence of the ventricles compared to the sulci. No extra-axial fluid collection. Vascular: No hyperdense vessel. Skull: Unremarkable. Sinuses/Orbits: No acute abnormality. Other: Head mastoid air cells are clear. ASPECTS ([place] Stroke Program Early CT Score) - Ganglionic level infarction (caudate, lentiform nuclei, internal capsule, insula, M1-M3 cortex): 7 - Supraganglionic infarction (M4-M6 cortex): 3 Total score (0-10 with 10 being normal): 10 IMPRESSION: There is no acute intracranial hemorrhage or evidence of acute infarction. ASPECT score is 10. Disproportionate prominence of the ventricles. May reflect central cerebral volume loss or communicating (normal pressure) hydrocephalus in the appropriate setting. Mild to moderate chronic microvascular ischemic changes. Preliminary results were communicated to Dr. Selina Cooley at 6:46 pm on 11/12/2020 by text page via the [facility] messaging system. Electronically Signed   By: Guadlupe Spanish M.D.   On: 11/12/2020 18:48   VAS US CAROTID  Result Date: 11/13/2020 Carotid Arterial Duplex Study Patient Name:  DELONTAE LAMM  Date of Exam:   11/13/2020 Medical Rec #: [id number]     Accession #:    [id number] Date of Birth: Apr 28, 1961     Patient Gender: M Patient Age:   59Y Exam Location:  [facility] Procedure:      VAS US CAROTID Referring Phys: [id number] JARED M GARDNER --------------------------------------------------------------------------------  Indications:       Numbness and Weakness. Risk Factors:      Hypertension. Limitations        Today's exam was limited due to patient movement/talking. Comparison  Study:  No prior study Performing Technologist: Sherren Kerns RVS  Examination Guidelines: A complete evaluation includes B-mode imaging, spectral Doppler, color Doppler, and power Doppler as needed of all accessible portions of each vessel. Bilateral testing is considered an integral part of a complete examination. Limited examinations for reoccurring indications may be performed as noted.  Right Carotid Findings: +----------+--------+--------+--------+------------------+------------------+           PSV cm/sEDV cm/sStenosisPlaque DescriptionComments           +----------+--------+--------+--------+------------------+------------------+ CCA Prox  100     22                                intimal thickening +----------+--------+--------+--------+------------------+------------------+ CCA Distal107     19                                intimal thickening +----------+--------+--------+--------+------------------+------------------+ ICA Prox  88      22                                                   +----------+--------+--------+--------+------------------+------------------+ ICA Distal92      27                                                   +----------+--------+--------+--------+------------------+------------------+  ECA       112     16                                                   +----------+--------+--------+--------+------------------+------------------+ +----------+--------+-------+--------------+-------------------+           PSV cm/sEDV cmsDescribe      Arm Pressure (mmHG) +----------+--------+-------+--------------+-------------------+ Subclavian               Not identified                    +----------+--------+-------+--------------+-------------------+ +---------+--------+--+--------+--+---------+ VertebralPSV cm/s81EDV cm/s27Antegrade +---------+--------+--+--------+--+---------+  Left Carotid Findings:  +----------+--------+--------+--------+------------------+------------------+           PSV cm/sEDV cm/sStenosisPlaque DescriptionComments           +----------+--------+--------+--------+------------------+------------------+ CCA Prox  104     25                                intimal thickening +----------+--------+--------+--------+------------------+------------------+ CCA Distal95      22                                intimal thickening +----------+--------+--------+--------+------------------+------------------+ ICA Prox  86      20                                                   +----------+--------+--------+--------+------------------+------------------+ ICA Distal153     39                                                   +----------+--------+--------+--------+------------------+------------------+ ECA       123     20                                                   +----------+--------+--------+--------+------------------+------------------+ +----------+--------+--------+--------+-------------------+           PSV cm/sEDV cm/sDescribeArm Pressure (mmHG) +----------+--------+--------+--------+-------------------+ [id number]                                          +----------+--------+--------+--------+-------------------+ +---------+--------+--+--------+--+---------+ VertebralPSV cm/s51EDV cm/s20Antegrade +---------+--------+--+--------+--+---------+   Summary: Right Carotid: The extracranial vessels were near-normal with only minimal wall                thickening or plaque. Left Carotid: The extracranial vessels were near-normal with only minimal wall               thickening or plaque. Vertebrals:  Bilateral vertebral arteries demonstrate antegrade flow. Subclavians: Normal flow hemodynamics were seen in bilateral subclavian              arteries. *See table(s) above for measurements and observations.     Preliminary  EKG EKG  Interpretation  Date/Time:  Saturday November 12 2020 19:24:45 EDT Ventricular Rate:  78 PR Interval:  60 QRS Duration: 87 QT Interval:  377 QTC Calculation: 430 R Axis:   68 Text Interpretation: Sinus rhythm Short PR interval Baseline wander in lead(s) II No old tracing to compare Confirmed by Pricilla Loveless [id number]) on 11/13/2020 3:33:58 PM  Radiology MR ANGIO HEAD WO CONTRAST  Result Date: 11/13/2020 CLINICAL DATA:  Initial evaluation for acute neuro deficit, stroke suspected. EXAM: MRI HEAD WITHOUT CONTRAST MRA HEAD WITHOUT CONTRAST TECHNIQUE: Multiplanar, multi-echo pulse sequences of the brain and surrounding structures were acquired without intravenous contrast. Angiographic images of the Circle of Willis were acquired using MRA technique without intravenous contrast. COMPARISON: No pertinent prior exam. COMPARISON:  Prior head CT from 11/12/2020. FINDINGS: MRI HEAD FINDINGS Brain: Cerebral volume within normal limits for age. Patchy T2/FLAIR hyperintensity involving the periventricular deep white matter both cerebral hemispheres could reflect chronic microvascular ischemic disease or possibly transependymal flow of CSF (or a combination there of). No abnormal foci of restricted diffusion to suggest acute or subacute ischemia. Gray-white matter differentiation maintained. No encephalomalacia suggest chronic cortical infarction. No evidence for acute or chronic intracranial hemorrhage. No mass lesion, midline shift or mass effect. Ventricles are somewhat prominent in at upper portion of cortical sulcation. While this finding could be related to atrophy, possible in pH could also have this appearance. Pituitary gland and suprasellar region within normal limits. Midline structures intact. No extra-axial collection. Vascular: Major intracranial vascular flow voids are maintained. Skull and upper cervical spine: Craniocervical junction within normal limits. Bone marrow signal intensity normal. No scalp  soft tissue abnormality. Sinuses/Orbits: Globes and orbital soft tissues demonstrate no acute finding. Paranasal sinuses are largely clear. No significant mastoid effusion. Inner ear structures grossly normal. Other: None. MRA HEAD FINDINGS Anterior circulation: Visualized distal cervical segments of the internal carotid arteries are patent with antegrade flow. Petrous, cavernous, and supraclinoid segments patent without stenosis or other abnormality. A1 segments patent bilaterally. Right A1 hypoplastic. Normal anterior communicating artery complex. Anterior cerebral arteries patent to their distal aspects without stenosis. No M1 stenosis or occlusion. Normal MCA bifurcations. Distal MCA branches well perfused and symmetric. Posterior circulation: Visualized portions of the distal V4 segments are widely patent. Neither PICA origin visualized. Basilar patent to its distal aspect without stenosis. Superior cerebellar arteries patent bilaterally. Both PCAs primarily supplied via the basilar well perfused to their distal aspects. No intracranial aneurysm. Anatomic variants: Hypoplastic right A1. IMPRESSION: MRI HEAD IMPRESSION: 1. No acute intracranial abnormality. 2. Prominence of the ventricular system at the upper portion of cortical sulcation. While this finding could be related to atrophy, possible NPH could also have this appearance. 3. Patchy T2/FLAIR hyperintensity involving the periventricular and deep white matter of both cerebral hemispheres. Finding could reflect changes of chronic microvascular ischemic disease or possibly transependymal flow of CSF (or a combination thereof). MRA HEAD IMPRESSION: Normal intracranial MRA. No large vessel occlusion, hemodynamically significant stenosis, or other acute vascular abnormality. Electronically Signed   By: Rise Mu M.D.   On: 11/13/2020 03:17   MR BRAIN WO CONTRAST  Result Date: 11/13/2020 CLINICAL DATA:  Initial evaluation for acute neuro deficit,  stroke suspected. EXAM: MRI HEAD WITHOUT CONTRAST MRA HEAD WITHOUT CONTRAST TECHNIQUE: Multiplanar, multi-echo pulse sequences of the brain and surrounding structures were acquired without intravenous contrast. Angiographic images of the Circle of Willis were acquired using MRA technique without intravenous contrast. COMPARISON: No pertinent prior exam. COMPARISON:  Prior head CT from 11/12/2020. FINDINGS: MRI HEAD FINDINGS Brain: Cerebral volume within normal limits for age. Patchy T2/FLAIR hyperintensity involving the periventricular deep white matter both cerebral hemispheres could reflect chronic microvascular ischemic disease or possibly transependymal flow of CSF (or a combination there of). No abnormal foci of restricted diffusion to suggest acute or subacute ischemia. Gray-white matter differentiation maintained. No encephalomalacia suggest chronic cortical infarction. No evidence for acute or chronic intracranial hemorrhage. No mass lesion, midline shift or mass effect. Ventricles are somewhat prominent in at upper portion of cortical sulcation. While this finding could be related to atrophy, possible in pH could also have this appearance. Pituitary gland and suprasellar region within normal limits. Midline structures intact. No extra-axial collection. Vascular: Major intracranial vascular flow voids are maintained. Skull and upper cervical spine: Craniocervical junction within normal limits. Bone marrow signal intensity normal. No scalp soft tissue abnormality. Sinuses/Orbits: Globes and orbital soft tissues demonstrate no acute finding. Paranasal sinuses are largely clear. No significant mastoid effusion. Inner ear structures grossly normal. Other: None. MRA HEAD FINDINGS Anterior circulation: Visualized distal cervical segments of the internal carotid arteries are patent with antegrade flow. Petrous, cavernous, and supraclinoid segments patent without stenosis or other abnormality. A1 segments patent  bilaterally. Right A1 hypoplastic. Normal anterior communicating artery complex. Anterior cerebral arteries patent to their distal aspects without stenosis. No M1 stenosis or occlusion. Normal MCA bifurcations. Distal MCA branches well perfused and symmetric. Posterior circulation: Visualized portions of the distal V4 segments are widely patent. Neither PICA origin visualized. Basilar patent to its distal aspect without stenosis. Superior cerebellar arteries patent bilaterally. Both PCAs primarily supplied via the basilar well perfused to their distal aspects. No intracranial aneurysm. Anatomic variants: Hypoplastic right A1. IMPRESSION: MRI HEAD IMPRESSION: 1. No acute intracranial abnormality. 2. Prominence of the ventricular system at the upper portion of cortical sulcation. While this finding could be related to atrophy, possible NPH could also have this appearance. 3. Patchy T2/FLAIR hyperintensity involving the periventricular and deep white matter of both cerebral hemispheres. Finding could reflect changes of chronic microvascular ischemic disease or possibly transependymal flow of CSF (or a combination thereof). MRA HEAD IMPRESSION: Normal intracranial MRA. No large vessel occlusion, hemodynamically significant stenosis, or other acute vascular abnormality. Electronically Signed   By: Rise MuBenjamin  McClintock M.D.   On: 11/13/2020 03:17   ECHOCARDIOGRAM COMPLETE  Result Date: 11/13/2020    ECHOCARDIOGRAM REPORT   Patient Name:   Jerilee FieldCRAIG Livesay Date of Exam: 11/13/2020 Medical Rec #:  [id number]    Height: Accession #:    [id number]   Weight:       221.8 lb Date of Birth:  15-Dec-1960    BSA:          2.163 m Patient Age:    59 years     BP:           178/85 mmHg Patient Gender: M            HR:           62 bpm. Exam Location:  Inpatient Procedure: 2D Echo, Cardiac Doppler and Color Doppler Indications:    Stroke  History:        Patient has no prior history of Echocardiogram examinations.                 Risk  Factors:Hypertension.  Sonographer:    Ross LudwigArthur Guy RDCS (AE) Referring Phys: [phone] JARED M GARDNER  Sonographer Comments: Suboptimal subcostal window. IMPRESSIONS  1. Left  ventricular ejection fraction, by estimation, is 60 to 65%. The left ventricle has normal function. The left ventricle has no regional wall motion abnormalities. There is mild left ventricular hypertrophy. Left ventricular diastolic parameters were normal.  2. Right ventricular systolic function is normal. The right ventricular size is normal.  3. Left atrial size was mildly dilated.  4. The mitral valve is normal in structure. Trivial mitral valve regurgitation. No evidence of mitral stenosis.  5. The aortic valve is tricuspid. Aortic valve regurgitation is not visualized. No aortic stenosis is present.  6. The inferior vena cava is normal in size with greater than 50% respiratory variability, suggesting right atrial pressure of 3 mmHg. FINDINGS  Left Ventricle: Left ventricular ejection fraction, by estimation, is 60 to 65%. The left ventricle has normal function. The left ventricle has no regional wall motion abnormalities. The left ventricular internal cavity size was normal in size. There is  mild left ventricular hypertrophy. Left ventricular diastolic parameters were normal. Right Ventricle: The right ventricular size is normal. No increase in right ventricular wall thickness. Right ventricular systolic function is normal. Left Atrium: Left atrial size was mildly dilated. Right Atrium: Right atrial size was normal in size. Pericardium: There is no evidence of pericardial effusion. Mitral Valve: The mitral valve is normal in structure. There is mild thickening of the mitral valve leaflet(s). Trivial mitral valve regurgitation. No evidence of mitral valve stenosis. MV peak gradient, 4.1 mmHg. The mean mitral valve gradient is 1.0 mmHg. Tricuspid Valve: The tricuspid valve is normal in structure. Tricuspid valve regurgitation is trivial. No  evidence of tricuspid stenosis. Aortic Valve: The aortic valve is tricuspid. Aortic valve regurgitation is not visualized. No aortic stenosis is present. Aortic valve mean gradient measures 4.0 mmHg. Aortic valve peak gradient measures 7.5 mmHg. Aortic valve area, by VTI measures 2.16 cm. Pulmonic Valve: The pulmonic valve was normal in structure. Pulmonic valve regurgitation is not visualized. No evidence of pulmonic stenosis. Aorta: The aortic root is normal in size and structure. Venous: The inferior vena cava is normal in size with greater than 50% respiratory variability, suggesting right atrial pressure of 3 mmHg. IAS/Shunts: No atrial level shunt detected by color flow Doppler.  LEFT VENTRICLE PLAX 2D LVIDd:         4.70 cm  Diastology LVIDs:         3.40 cm  LV e' medial:    7.18 cm/s LV PW:         1.10 cm  LV E/e' medial:  10.4 LV IVS:        1.20 cm  LV e' lateral:   8.70 cm/s LVOT diam:     1.90 cm  LV E/e' lateral: 8.6 LV SV:         56 LV SV Index:   26 LVOT Area:     2.84 cm  RIGHT VENTRICLE RV Basal diam:  3.10 cm RV S prime:     14.80 cm/s TAPSE (M-mode): 2.6 cm LEFT ATRIUM             Index       RIGHT ATRIUM           Index LA diam:        4.00 cm 1.85 cm/m  RA Area:     12.20 cm LA Vol (A2C):   80.3 ml 37.12 ml/m RA Volume:   25.00 ml  11.56 ml/m LA Vol (A4C):   52.2 ml 24.13 ml/m LA Biplane Vol: 68.7  ml 31.76 ml/m  AORTIC VALVE AV Area (Vmax):    2.17 cm AV Area (Vmean):   2.19 cm AV Area (VTI):     2.16 cm AV Vmax:           137.00 cm/s AV Vmean:          98.500 cm/s AV VTI:            0.258 m AV Peak Grad:      7.5 mmHg AV Mean Grad:      4.0 mmHg LVOT Vmax:         105.00 cm/s LVOT Vmean:        76.100 cm/s LVOT VTI:          0.197 m LVOT/AV VTI ratio: 0.76  AORTA Ao Root diam: 3.60 cm Ao Asc diam:  3.20 cm MITRAL VALVE MV Area (PHT): 2.62 cm    SHUNTS MV Area VTI:   1.76 cm    Systemic VTI:  0.20 m MV Peak grad:  4.1 mmHg    Systemic Diam: 1.90 cm MV Mean grad:  1.0 mmHg MV  Vmax:       1.01 m/s MV Vmean:      54.6 cm/s MV Decel Time: 289 msec MV E velocity: 74.80 cm/s MV A velocity: 71.00 cm/s MV E/A ratio:  1.05 Charlton Haws MD Electronically signed by Charlton Haws MD Signature Date/Time: 11/13/2020/4:27:20 PM    Final    CT HEAD CODE STROKE WO CONTRAST  Result Date: 11/12/2020 CLINICAL DATA:  Code stroke.  Left facial droop and numbness EXAM: CT HEAD WITHOUT CONTRAST TECHNIQUE: Contiguous axial images were obtained from the base of the skull through the vertex without intravenous contrast. COMPARISON:  None. FINDINGS: Brain: There is no acute intracranial hemorrhage, mass effect, or edema. Gray-white differentiation is preserved. Patchy hypoattenuation in the supratentorial white matter is nonspecific but may reflect mild to moderate chronic microvascular ischemic changes. There is disproportionate prominence of the ventricles compared to the sulci. No extra-axial fluid collection. Vascular: No hyperdense vessel. Skull: Unremarkable. Sinuses/Orbits: No acute abnormality. Other: Head mastoid air cells are clear. ASPECTS ([place] Stroke Program Early CT Score) - Ganglionic level infarction (caudate, lentiform nuclei, internal capsule, insula, M1-M3 cortex): 7 - Supraganglionic infarction (M4-M6 cortex): 3 Total score (0-10 with 10 being normal): 10 IMPRESSION: There is no acute intracranial hemorrhage or evidence of acute infarction. ASPECT score is 10. Disproportionate prominence of the ventricles. May reflect central cerebral volume loss or communicating (normal pressure) hydrocephalus in the appropriate setting. Mild to moderate chronic microvascular ischemic changes. Preliminary results were communicated to Dr. Selina Cooley at 6:46 pm on 11/12/2020 by text page via the [facility] messaging system. Electronically Signed   By: Guadlupe Spanish M.D.   On: 11/12/2020 18:48   VAS US CAROTID  Result Date: 11/13/2020 Carotid Arterial Duplex Study Patient Name:  ESPEN BETHEL  Date of Exam:    11/13/2020 Medical Rec #: [id number]     Accession #:    [id number] Date of Birth: 12/09/1960     Patient Gender: M Patient Age:   59Y Exam [facility] Procedure:      VAS US CAROTID Referring Phys: [id number] JARED M GARDNER --------------------------------------------------------------------------------  Indications:       Numbness and Weakness. Risk Factors:      Hypertension. Limitations        Today's exam was limited due to patient movement/talking. Comparison Study:  No prior study Performing Technologist: Sherren Kerns RVS  Examination Guidelines: A complete evaluation includes B-mode imaging, spectral Doppler, color Doppler, and power Doppler as needed of all accessible portions of each vessel. Bilateral testing is considered an integral part of a complete examination. Limited examinations for reoccurring indications may be performed as noted.  Right Carotid Findings: +----------+--------+--------+--------+------------------+------------------+           PSV cm/sEDV cm/sStenosisPlaque DescriptionComments           +----------+--------+--------+--------+------------------+------------------+ CCA Prox  100     22                                intimal thickening +----------+--------+--------+--------+------------------+------------------+ CCA Distal107     19                                intimal thickening +----------+--------+--------+--------+------------------+------------------+ ICA Prox  88      22                                                   +----------+--------+--------+--------+------------------+------------------+ ICA Distal92      27                                                   +----------+--------+--------+--------+------------------+------------------+ ECA       112     16                                                   +----------+--------+--------+--------+------------------+------------------+  +----------+--------+-------+--------------+-------------------+           PSV cm/sEDV cmsDescribe      Arm Pressure (mmHG) +----------+--------+-------+--------------+-------------------+ Subclavian               Not identified                    +----------+--------+-------+--------------+-------------------+ +---------+--------+--+--------+--+---------+ VertebralPSV cm/s81EDV cm/s27Antegrade +---------+--------+--+--------+--+---------+  Left Carotid Findings: +----------+--------+--------+--------+------------------+------------------+           PSV cm/sEDV cm/sStenosisPlaque DescriptionComments           +----------+--------+--------+--------+------------------+------------------+ CCA Prox  104     25                                intimal thickening +----------+--------+--------+--------+------------------+------------------+ CCA Distal95      22                                intimal thickening +----------+--------+--------+--------+------------------+------------------+ ICA Prox  86      20                                                   +----------+--------+--------+--------+------------------+------------------+ ICA Distal153     39                                                   +----------+--------+--------+--------+------------------+------------------+  ECA       123     20                                                   +----------+--------+--------+--------+------------------+------------------+ +----------+--------+--------+--------+-------------------+           PSV cm/sEDV cm/sDescribeArm Pressure (mmHG) +----------+--------+--------+--------+-------------------+ [id number]                                          +----------+--------+--------+--------+-------------------+ +---------+--------+--+--------+--+---------+ VertebralPSV cm/s51EDV cm/s20Antegrade +---------+--------+--+--------+--+---------+   Summary:  Right Carotid: The extracranial vessels were near-normal with only minimal wall                thickening or plaque. Left Carotid: The extracranial vessels were near-normal with only minimal wall               thickening or plaque. Vertebrals:  Bilateral vertebral arteries demonstrate antegrade flow. Subclavians: Normal flow hemodynamics were seen in bilateral subclavian              arteries. *See table(s) above for measurements and observations.     Preliminary     Procedures Procedures   Medications Ordered in ED Medications  sodium chloride flush (NS) 0.9 % injection 3 mL (3 mLs Intravenous Not Given 11/13/20 0245)  enoxaparin (LOVENOX) injection 40 mg (40 mg Subcutaneous Given 11/13/20 0448)  clopidogrel (PLAVIX) tablet 75 mg (75 mg Oral Given 11/13/20 0908)  hydrocortisone cream 1 % 1 application (has no administration in time range)  albuterol (PROVENTIL) (2.5 MG/3ML) 0.083% nebulizer solution 3 mL (has no administration in time range)  acetaminophen (TYLENOL) tablet 1,000 mg (1,000 mg Oral Given 11/13/20 1231)  pantoprazole (PROTONIX) EC tablet 20 mg (20 mg Oral Given 11/13/20 1004)  LORazepam (ATIVAN) injection 1 mg (1 mg Intravenous Not Given 11/13/20 0316)  fenofibrate tablet 160 mg (160 mg Oral Given 11/13/20 1635)  amLODipine (NORVASC) tablet 5 mg (5 mg Oral Given 11/13/20 1635)   stroke: mapping our early stages of recovery book (1 each Does not apply Given 11/12/20 2000)    ED Course  I have reviewed the triage vital signs and the nursing notes.  Pertinent labs & imaging results that were available during my care of the patient were reviewed by me and considered in my medical decision making (see chart for details).    MDM Rules/Calculators/A&P                         Iv ns. Continuous pulse ox and cardiac monitoring. Stat labs and imaging. Pt was code stroke activation on arrival - neurology emergently consulted and evaluated at bedside - indicates not candidate for tpa as symptoms  v mild.   Reviewed nursing notes and prior charts for additional history.   Labs reviewed/interpreted by me - chem normal.   CT reviewed/interpreted by me - no hem.  Neurology indicates admit to medicine, they will consult. Medicine service consulted for admission.     Final Clinical Impression(s) / ED Diagnoses Final diagnoses:  Weakness on left side of face  Acute CVA (cerebrovascular accident) (HCC)  Elevated blood pressure reading    Rx / DC Orders ED Discharge Orders     None  Cathren Laine, MD 11/13/20 [phone]

## 2020-11-12 NOTE — Consult Note (Signed)
NEUROLOGY CONSULTATION NOTE   Date of service: November 12, 2020 Patient Name: Richard Vaughan MRN:  [id number] DOB:  12/23/1960 Reason for consult: L sided paresthesias, unclear LKW _ _ _   _ __   _ __ _ _  __ __   _ __   __ _  History of Present Illness   This is a 60 year old gentleman with a history of hypertension and chronic pain syndrome who presented to the ED for left-sided paresthesias.  Stroke alert was activated for report of left-sided weakness and numbness with a last known well of 1600 today.  Unfortunately patient is an extremely difficult historian and I did not get nearly a clear history from him.  Yesterday he was mowing the grass 4 hours and afterwards felt very exhausted and had some unspecified difficulty ambulating.  He had difficulty getting in and out of the shower although he cannot tell me if he felt weak on one side.  Today he reported development of left-sided paresthesias in his face arm and leg.  He thinks it began this afternoon but he is not sure.   CT head NAICP. tPA not administered 2/2 unclear LKW. CTA not performed 2/2 exam not c/w LVO.  He does not have a history of stroke and tells me that he does not receive regular medical care.  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  He states that his only regular medication is Tylenol.  Reports family history of strokes in his mother.   ROS   Per HPI  Past History   Past Medical History:  Diagnosis Date   Arthropathic psoriasis (HCC)    Chronic pain syndrome    HTN (hypertension)    Past Surgical History:  Procedure Laterality Date   HAND SURGERY Right    No family history on file. Social History   Socioeconomic History   Marital status: Single    Spouse name: Not on file   Number of children: Not on file   Years of education: Not on file   Highest education level: Not on file  Occupational History   Not on file   Tobacco Use   Smoking status: Not on file   Smokeless tobacco: Not on file  Substance and Sexual Activity   Alcohol use: Not on file   Drug use: Not on file   Sexual activity: Not on file  Other Topics Concern   Not on file  Social History Narrative   Not on file   Social Determinants of Health   Financial Resource Strain: Not on file  Food Insecurity: Not on file  Transportation Needs: Not on file  Physical Activity: Not on file  Stress: Not on file  Social Connections: Not on file   Allergies  Allergen Reactions   Aspirin Anaphylaxis    Medications   Per patient takes multiple doses of tylenol daily, no other medications    Vitals   Vitals:   11/12/20 1830 11/12/20 1831  BP: (!) 175/106   Pulse: 90   Resp: 14   Temp: 98.5 F (36.9 C)   SpO2: 97%   Weight:  100.6 kg     There is no height or weight on file to calculate BMI.  Physical Exam   Physical Exam Gen: alert, oriented x3, very tangential, follows simple commands Resp: CTAB CV: RRR  Neuro: *MS: alert, oriented x3, very  tangential, follows simple commands *Speech: fluid, nondysarthric, able to name and repeat *CN:    I: Deferred   II,III: PERRLA, ?L lateral hemifield deficit L eye only   III,IV,VI: EOMI w/o nystagmus, no ptosis   V: sensation impaired to LT L V2/V3   VII: Eyelid closure was full.  Mild L UMN facial droop   VIII: Hearing intact to voice   IX,X: Voice normal, palate elevates symmetrically    XI: SCM/trap 5/5 bilat   XII: Tongue protrudes midline, no atrophy or fasciculations  *Motor:   Normal bulk.  No tremor, rigidity or bradykinesia. BUE no drift but weaker grip 4+/5 on L compared to R. BLE exam some movement against gravity, extremely pain-limited (chronic back pain) *Sensory impairment to LT LUE and LLE *Reflexes: 2+ brisk symmetric, toes equiv *Coordination: FNF intact bilat *Gait: able to stand, bear weight, and walk a few steps without focality or  instability   NIHSS  1a Level of Conscious.: 0 1b LOC Questions: 0 1c LOC Commands: 0 2 Best Gaze: 0 3 Visual: 1 4 Facial Palsy: 1 5a Motor Arm - left: 0 5b Motor Arm - Right: 0 6a Motor Leg - Left: 2 6b Motor Leg - Right: 2 7 Limb Ataxia: 0 8 Sensory: 1 9 Best Language: 0 10 Dysarthria: 0 11 Extinct. and Inatten.: 0  TOTAL: 7   Premorbid mRS = 3 (reports sometimes uses walker to get around, but cannot clarify exactly why)   Labs   CBC:  Recent Labs  Lab 11/12/20 1837 11/12/20 1850  WBC 9.1  --   NEUTROABS 5.3  --   HGB 12.5* 12.9*  HCT 39.3 38.0*  MCV 84.9  --   PLT 348  --     Basic Metabolic Panel:  Lab Results  Component Value Date   NA 136 11/12/2020   K 3.8 11/12/2020   CO2 21 (L) 11/12/2020   GLUCOSE 130 (H) 11/12/2020   BUN 6 (L) 11/12/2020   CREATININE 0.80 11/12/2020   CALCIUM 8.7 (L) 11/12/2020   GFRNONAA >60 11/12/2020   Lipid Panel: No results found for: LDLCALC HgbA1c: No results found for: HGBA1C Urine Drug Screen: No results found for: LABOPIA, COCAINSCRNUR, LABBENZ, AMPHETMU, THCU, LABBARB  Alcohol Level No results found for: ETH   Impression   61 yo gentleman with hx chronic back pain s/p multiple laminectomies p/w L-sided paresthesias, L UMN facial droop, decreased L grip strength. LKW unclear and tPA not administered. Exam not c/w LVO. Admit for stroke w/u  Recommendations   - Admit to hospitalist service for stroke w/u; stroke team will consult - Permissive HTN x48 hrs from sx onset or until stroke ruled out by MRI goal BP <220/110. PRN labetalol or hydralazine if BP above these parameters. Avoid oral antihypertensives. - MRI brain wo contrast - MRA H&N - TTE - Check A1c and LDL + add statin per guidelines - Bedside swallow screen - Plavix 75mg  daily (allergic to ASA) - q4 hr neuro checks - STAT head CT for any change in neuro exam - Tele - PT/OT/SLP - Stroke education - Amb referral to neurology upon discharge    Stroke team will continue to follow.   ______________________________________________________________________   Thank you for the opportunity to take part in the care of this patient. If you have any further questions, please contact the neurology consultation attending.  Signed,  , MD [facility] [phone]  If 7pm- 7am, please page neurology on call as listed in AMION.

## 2020-11-12 NOTE — ED Notes (Signed)
[facility] called to activate Code Stroke 

## 2020-11-12 NOTE — ED Triage Notes (Signed)
Pt arrives with L sided weakness and L facial droop since 1600 today. Was normal when he woke up from a nap and then symptoms began.

## 2020-11-13 ENCOUNTER — Observation Stay (HOSPITAL_COMMUNITY): Payer: Medicare Other

## 2020-11-13 ENCOUNTER — Observation Stay (HOSPITAL_BASED_OUTPATIENT_CLINIC_OR_DEPARTMENT_OTHER): Payer: Medicare Other

## 2020-11-13 DIAGNOSIS — I10 Essential (primary) hypertension: Secondary | ICD-10-CM | POA: Diagnosis not present

## 2020-11-13 DIAGNOSIS — I639 Cerebral infarction, unspecified: Secondary | ICD-10-CM

## 2020-11-13 LAB — LIPID PANEL
Cholesterol: 169 mg/dL (ref 0–200)
HDL: 22 mg/dL — ABNORMAL LOW (ref >40)
LDL Cholesterol: UNDETERMINED mg/dL (ref 0–99)
Total CHOL/HDL Ratio: 7.7 RATIO
Triglycerides: 509 mg/dL — ABNORMAL HIGH (ref <150)
VLDL: UNDETERMINED mg/dL (ref 0–40)

## 2020-11-13 LAB — ECHOCARDIOGRAM COMPLETE
AR max vel: 2.17 cm2
AV Area VTI: 2.16 cm2
AV Area mean vel: 2.19 cm2
AV Mean grad: 4 mmHg
AV Peak grad: 7.5 mmHg
Ao pk vel: 1.37 m/s
Area-P 1/2: 2.62 cm2
MV VTI: 1.76 cm2
S' Lateral: 3.4 cm
Weight: 3548.52 oz

## 2020-11-13 LAB — LDL CHOLESTEROL, DIRECT: Direct LDL: 60.3 mg/dL (ref 0–99)

## 2020-11-13 LAB — HIV ANTIBODY (ROUTINE TESTING W REFLEX): HIV Screen 4th Generation wRfx: NONREACTIVE

## 2020-11-13 LAB — SARS CORONAVIRUS 2 (TAT 6-24 HRS): SARS Coronavirus 2: NEGATIVE

## 2020-11-13 IMAGING — MR MR HEAD W/O CM
10 of 11 series · 43 of 48 positions shown · non-contrast
Comparison: No pertinent prior exam.
COMPARISON: Prior head CT from [DATE].

CLINICAL DATA: Initial evaluation for acute neuro deficit, stroke
suspected.

EXAM:
MRI HEAD WITHOUT CONTRAST
MRA HEAD WITHOUT CONTRAST
TECHNIQUE: Multiplanar, multi-echo pulse sequences of the brain and surrounding
structures were acquired without intravenous contrast. Angiographic
images of the Circle of Willis were acquired using MRA technique
without intravenous contrast.

[Series 5: DWI · axial · 3.0mm · 0.88mm/px · z∈[-103,+43]mm · 10 of 100 slices shown (1 of 4)]
[im 1/100]
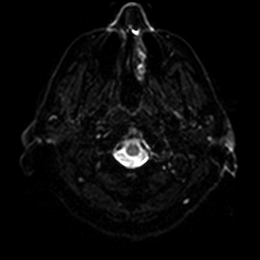
[im 12/100]
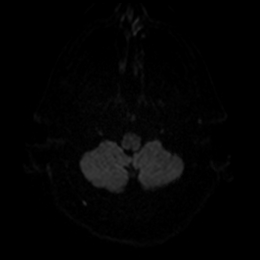
[im 23/100]
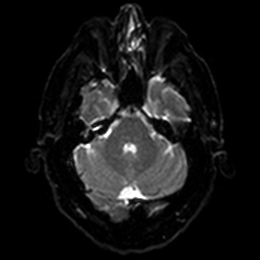
[im 34/100]
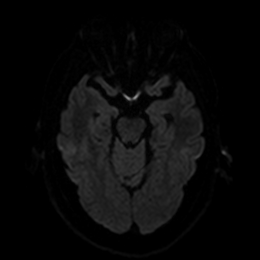
[im 45/100]
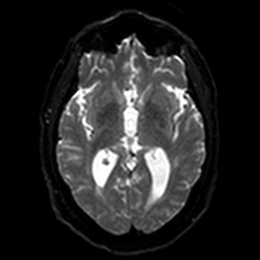
[im 56/100]
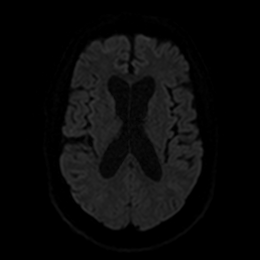
[im 67/100]
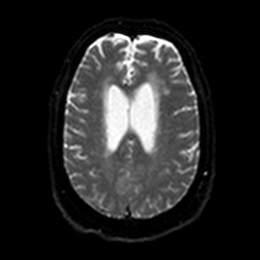
[im 78/100]
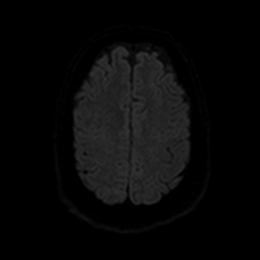
[im 89/100]
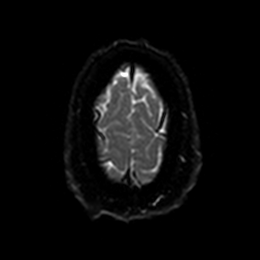
[im 100/100]
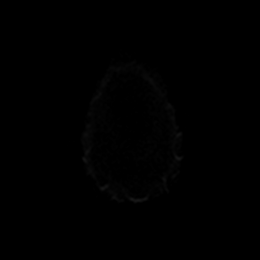

[Series 6: DWI · axial · 3.0mm · 0.88mm/px · z∈[-103,+43]mm · 5 of 50 slices shown (2 of 4)]
[im 1/50]
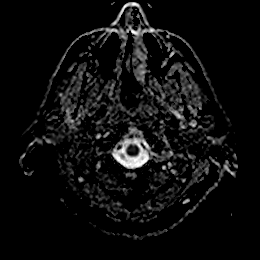
[im 13/50]
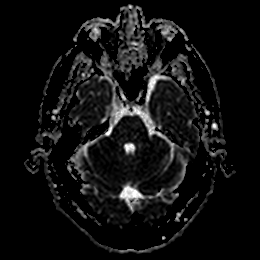
[im 25/50]
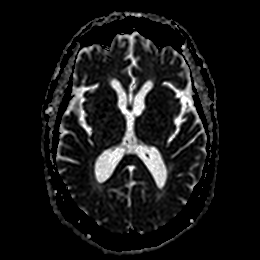
[im 37/50]
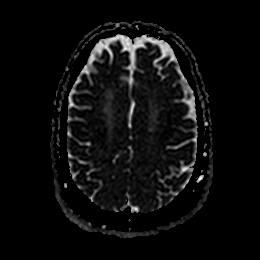
[im 50/50]
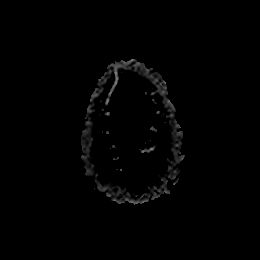

[Series 7: DWI · coronal · 4.0mm · 0.88mm/px · 6 of 66 slices shown (3 of 4)]
[im 1/66]
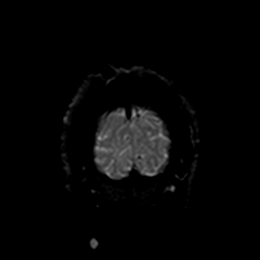
[im 14/66]
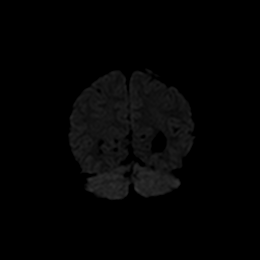
[im 27/66]
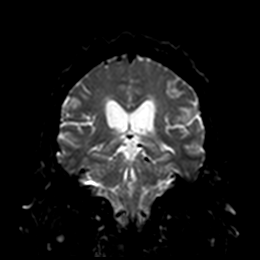
[im 40/66]
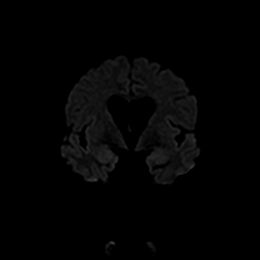
[im 53/66]
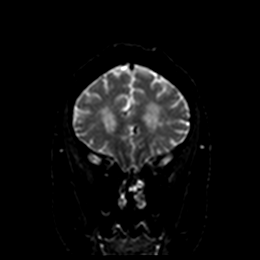
[im 66/66]
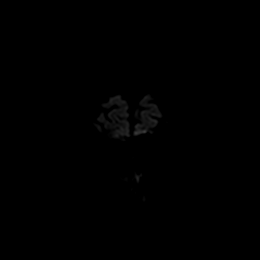

[Series 8: DWI · coronal · 4.0mm · 0.88mm/px · 3 of 33 slices shown (4 of 4)]
[im 1/33]
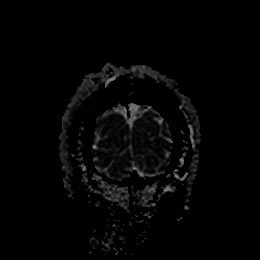
[im 17/33]
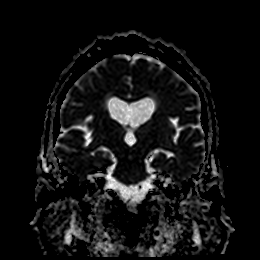
[im 33/33]
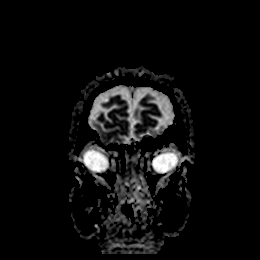

[Series 13: T1 · sagittal · 5.0mm · 0.75mm/px · 2 of 23 slices shown]
[im 1/23]
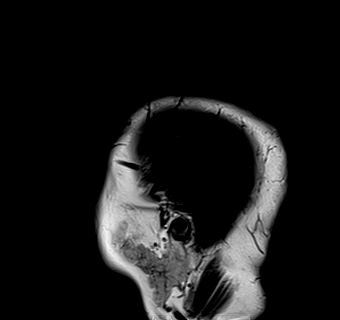
[im 23/23]
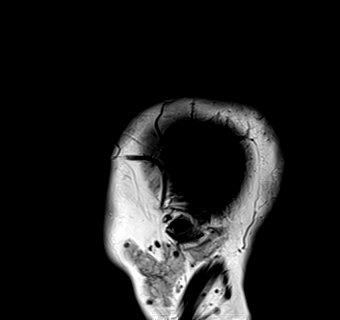

[Series 14: T2 · axial · 5.0mm · 0.72mm/px · z∈[-99,+44]mm · 2 of 25 slices shown (1 of 2)]
[im 1/25]
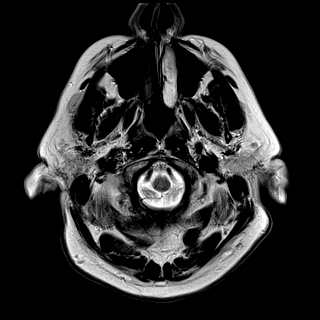
[im 25/25]
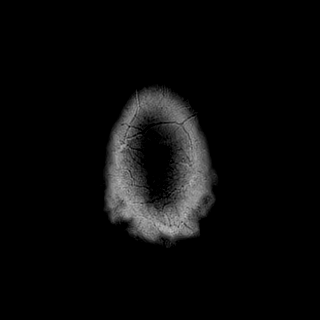

[Series 15: FLAIR · axial · 5.0mm · 0.45mm/px · z∈[-98,+45]mm · 2 of 25 slices shown]
[im 1/25]
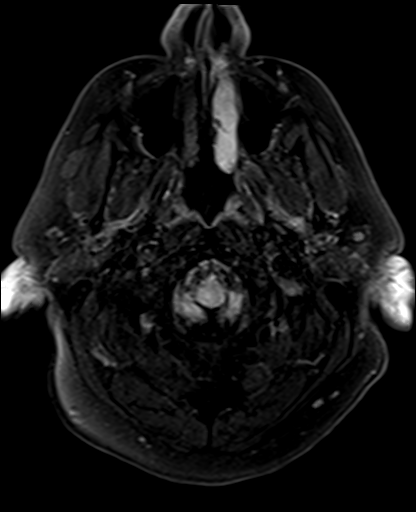
[im 25/25]
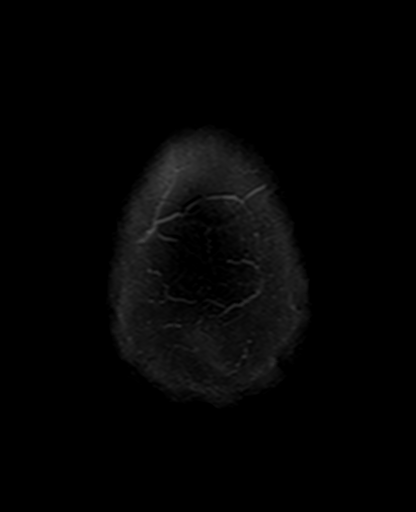

[Series 17: pha_images · axial · 3.0mm · 0.90mm/px · z∈[-114,+61]mm · 5 of 60 slices shown]
[im 1/60]
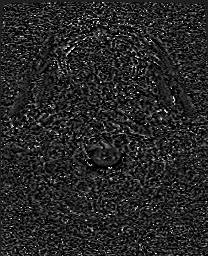
[im 15/60]
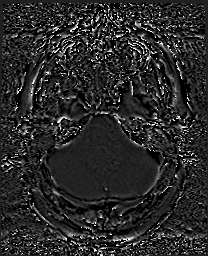
[im 30/60]
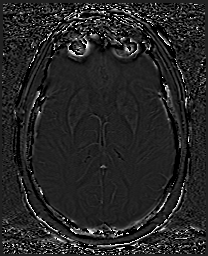
[im 45/60]
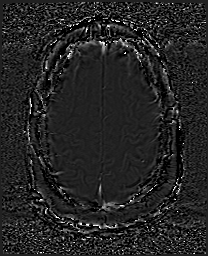
[im 60/60]
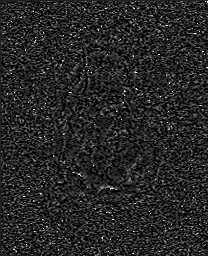

[Series 18: swi_images · axial · 3.0mm · 0.90mm/px · z∈[-114,+61]mm · 5 of 60 slices shown]
[im 1/60]
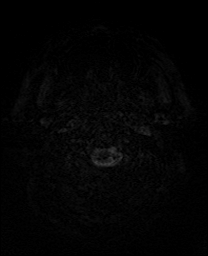
[im 15/60]
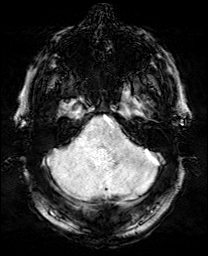
[im 30/60]
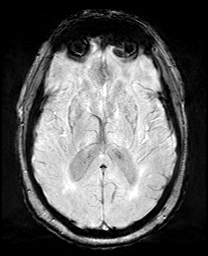
[im 45/60]
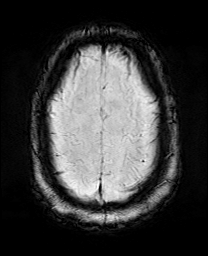
[im 60/60]
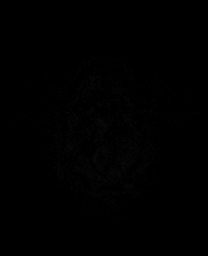

[Series 21: T2 · coronal · 5.0mm · 0.34mm/px · 3 of 29 slices shown (2 of 2)]
[im 1/29]
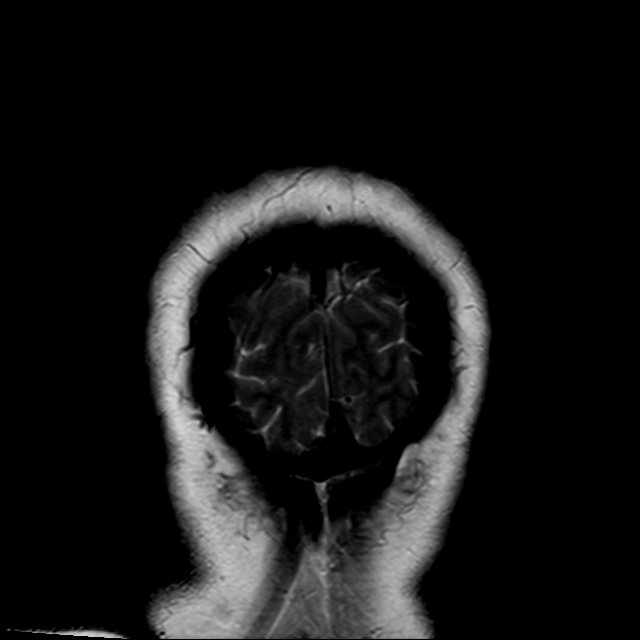
[im 15/29]
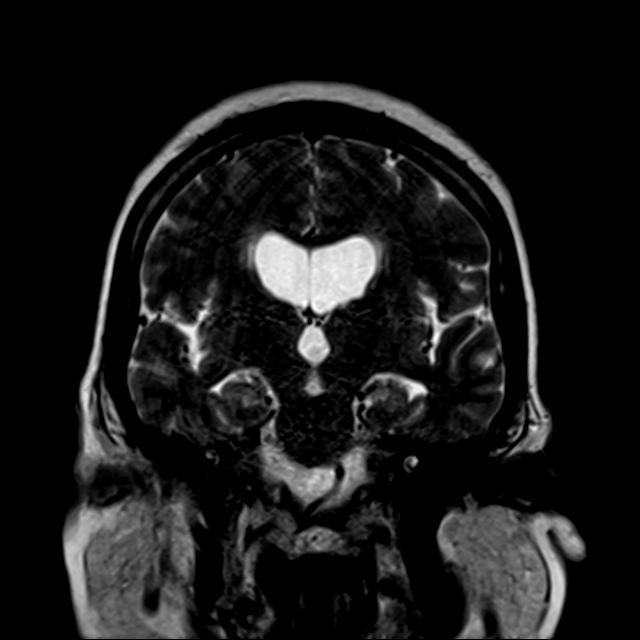
[im 29/29]
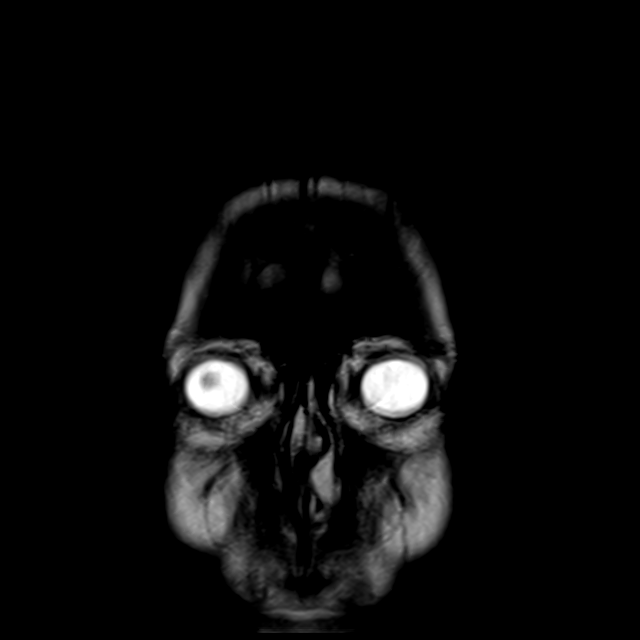

[43 of 48 positions shown; findings below may reference images not displayed]

FINDINGS: MRI HEAD FINDINGS

Brain: Cerebral volume within normal limits for age. Patchy T2/FLAIR
hyperintensity involving the periventricular deep white matter both
cerebral hemispheres could reflect chronic microvascular ischemic
disease or possibly transependymal flow of CSF (or a combination
there of).

No abnormal foci of restricted diffusion to suggest acute or
subacute ischemia. Gray-white matter differentiation maintained. No
encephalomalacia suggest chronic cortical infarction. No evidence
for acute or chronic intracranial hemorrhage.

No mass lesion, midline shift or mass effect. Ventricles are
somewhat prominent in at upper portion of cortical sulcation. While
this finding could be related to atrophy, possible in pH could also
have this appearance. Pituitary gland and suprasellar region within
normal limits. Midline structures intact. No extra-axial collection.

Vascular: Major intracranial vascular flow voids are maintained.

Skull and upper cervical spine: Craniocervical junction within
normal limits. Bone marrow signal intensity normal. No scalp soft
tissue abnormality.

Sinuses/Orbits: Globes and orbital soft tissues demonstrate no acute
finding. Paranasal sinuses are largely clear. No significant mastoid
effusion. Inner ear structures grossly normal.

Other: None.

MRA HEAD FINDINGS

Anterior circulation: Visualized distal cervical segments of the
internal carotid arteries are patent with antegrade flow. Petrous,
cavernous, and supraclinoid segments patent without stenosis or
other abnormality. A1 segments patent bilaterally. Right A1
hypoplastic. Normal anterior communicating artery complex. Anterior
cerebral arteries patent to their distal aspects without stenosis.
No M1 stenosis or occlusion. Normal MCA bifurcations. Distal MCA
branches well perfused and symmetric.

Posterior circulation: Visualized portions of the distal V4 segments
are widely patent. Neither PICA origin visualized. Basilar patent to
its distal aspect without stenosis. Superior cerebellar arteries
patent bilaterally. Both PCAs primarily supplied via the basilar
well perfused to their distal aspects.

No intracranial aneurysm.

Anatomic variants: Hypoplastic right A1.
IMPRESSION: MRI HEAD IMPRESSION:

1. No acute intracranial abnormality.
2. Prominence of the ventricular system at the upper portion of
cortical sulcation. While this finding could be related to atrophy,
possible NPH could also have this appearance.
3. Patchy T2/FLAIR hyperintensity involving the periventricular and
deep white matter of both cerebral hemispheres. Finding could
reflect changes of chronic microvascular ischemic disease or
possibly transependymal flow of CSF (or a combination thereof).

MRA HEAD IMPRESSION:

Normal intracranial MRA. No large vessel occlusion, hemodynamically
significant stenosis, or other acute vascular abnormality.

## 2020-11-13 IMAGING — MR MR MRA HEAD W/O CM
1 series · 18 of 48 positions shown · non-contrast
Comparison: No pertinent prior exam.
COMPARISON: Prior head CT from [DATE].

CLINICAL DATA: Initial evaluation for acute neuro deficit, stroke
suspected.

EXAM:
MRI HEAD WITHOUT CONTRAST
MRA HEAD WITHOUT CONTRAST
TECHNIQUE: Multiplanar, multi-echo pulse sequences of the brain and surrounding
structures were acquired without intravenous contrast. Angiographic
images of the Circle of Willis were acquired using MRA technique
without intravenous contrast.

[Series 9: 3d cow · axial · 0.5mm · 0.41mm/px · z∈[-88,-7]mm · 18 of 172 slices shown]
[im 1/172]
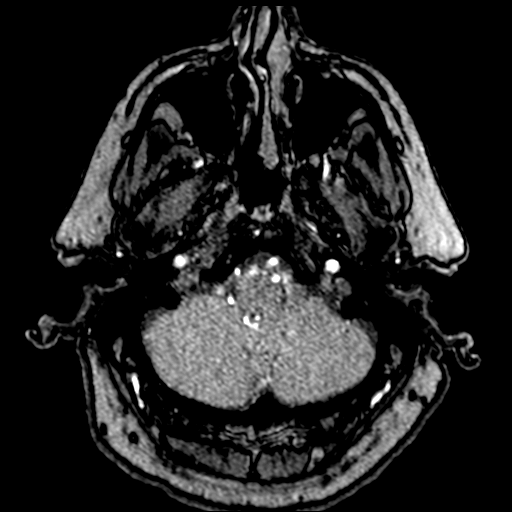
[im 4/172]
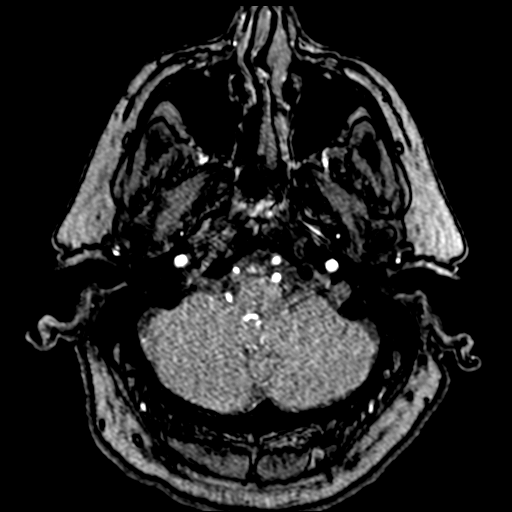
[im 8/172]
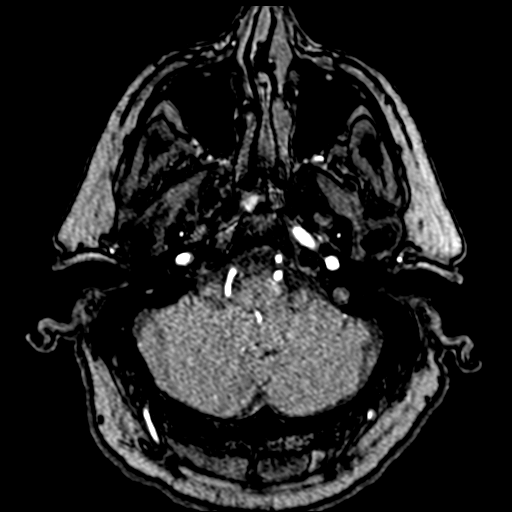
[im 11/172]
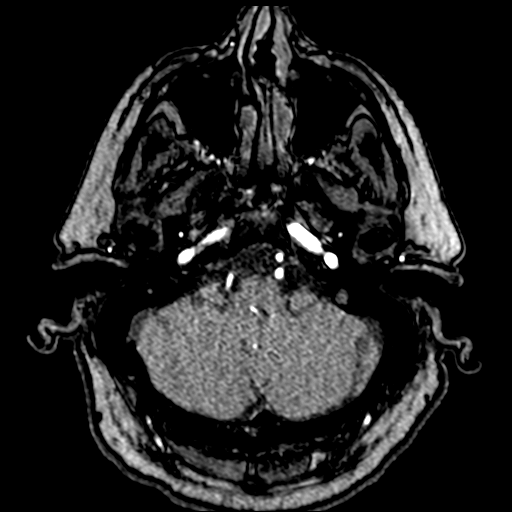
[im 15/172]
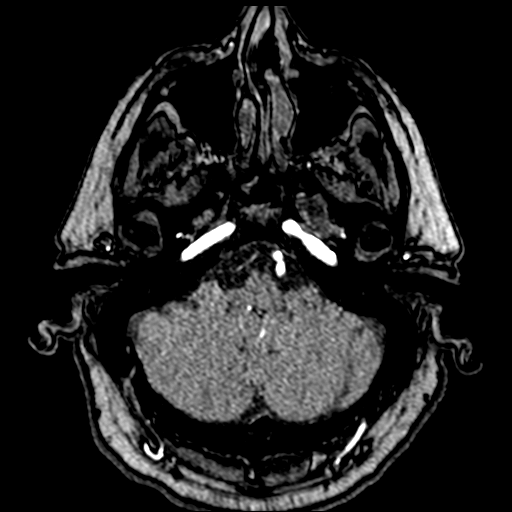
[im 19/172]
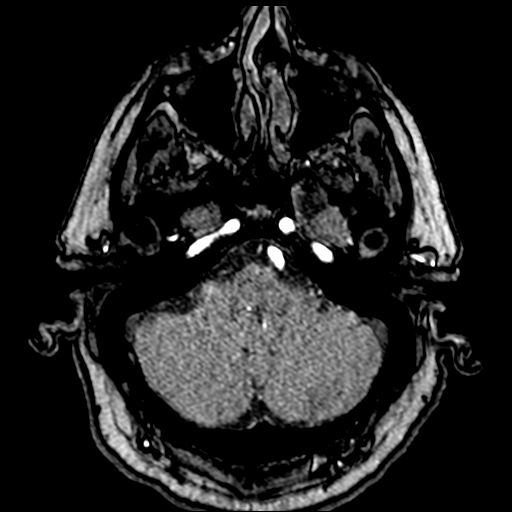
[im 22/172]
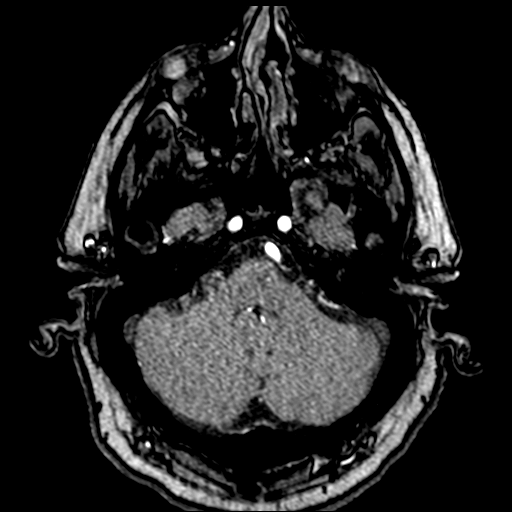
[im 26/172]
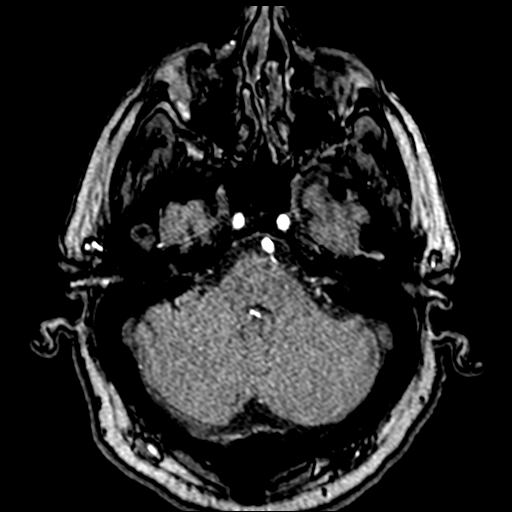
[im 30/172]
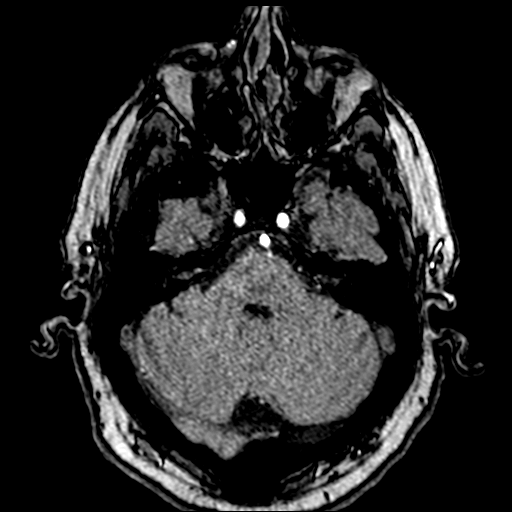
[im 33/172]
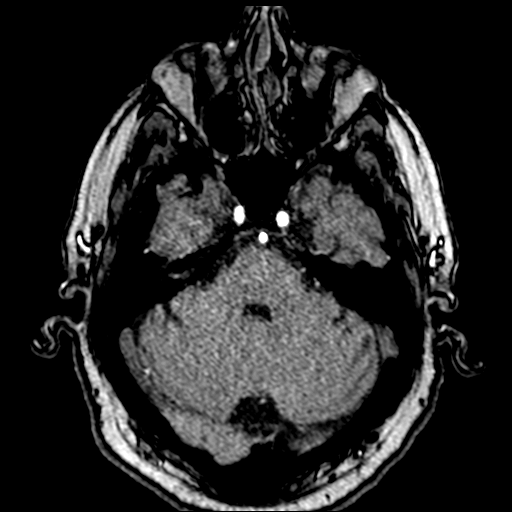
[im 55/172]
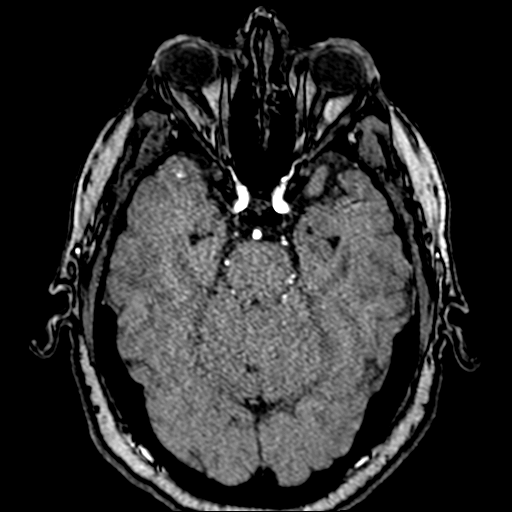
[im 77/172]
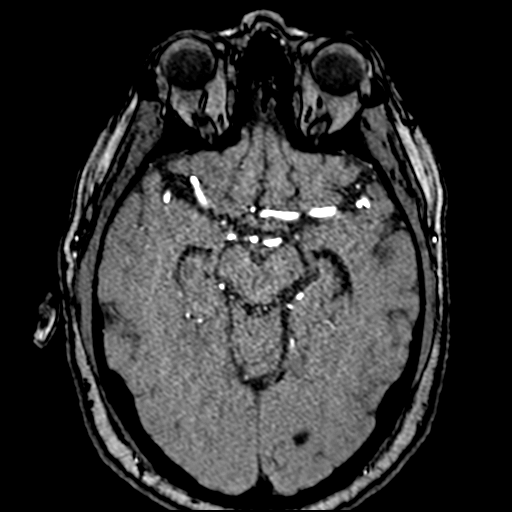
[im 88/172]
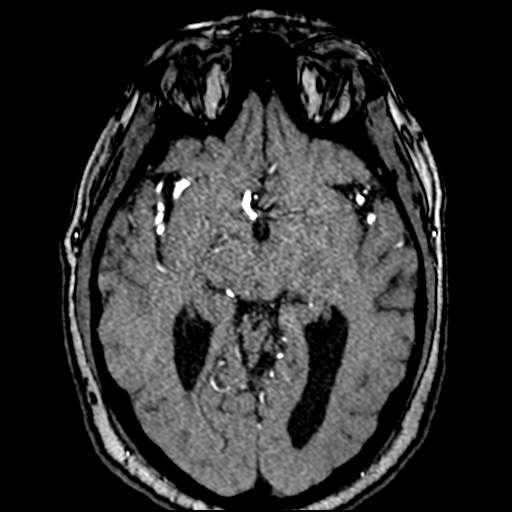
[im 99/172]
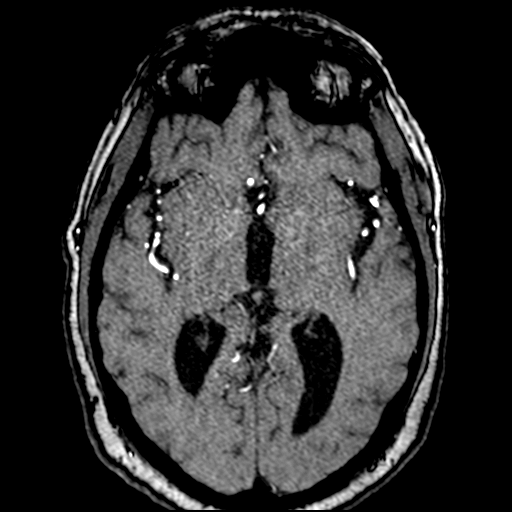
[im 121/172]
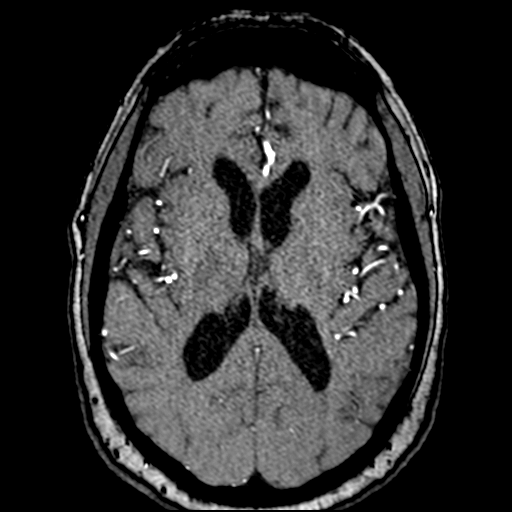
[im 142/172]
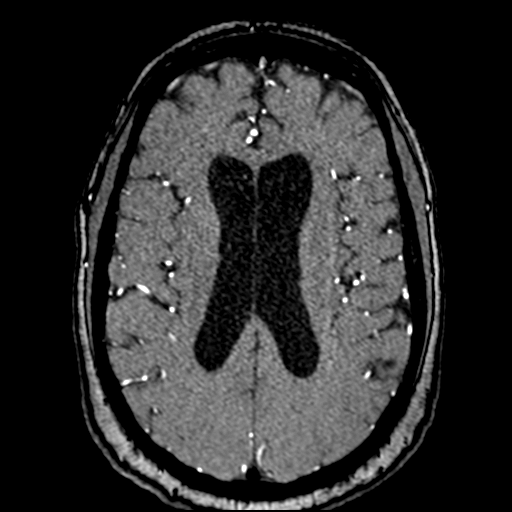
[im 146/172]
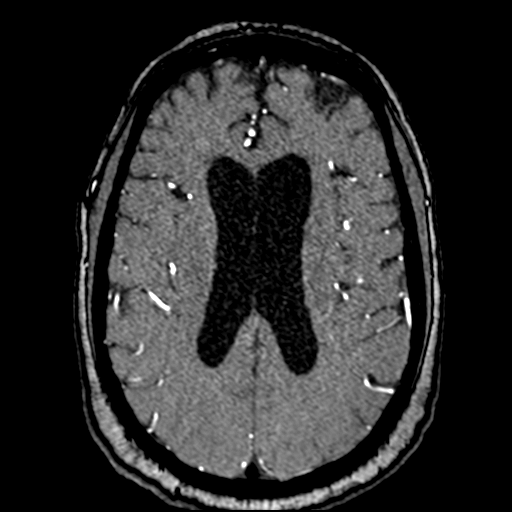
[im 164/172]
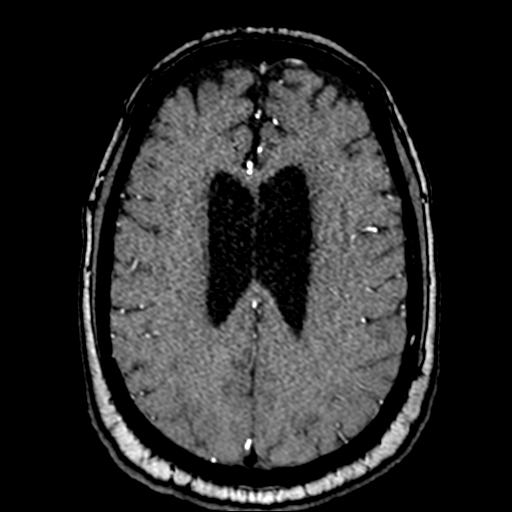

[18 of 48 positions shown; findings below may reference images not displayed]

FINDINGS: MRI HEAD FINDINGS

Brain: Cerebral volume within normal limits for age. Patchy T2/FLAIR
hyperintensity involving the periventricular deep white matter both
cerebral hemispheres could reflect chronic microvascular ischemic
disease or possibly transependymal flow of CSF (or a combination
there of).

No abnormal foci of restricted diffusion to suggest acute or
subacute ischemia. Gray-white matter differentiation maintained. No
encephalomalacia suggest chronic cortical infarction. No evidence
for acute or chronic intracranial hemorrhage.

No mass lesion, midline shift or mass effect. Ventricles are
somewhat prominent in at upper portion of cortical sulcation. While
this finding could be related to atrophy, possible in pH could also
have this appearance. Pituitary gland and suprasellar region within
normal limits. Midline structures intact. No extra-axial collection.

Vascular: Major intracranial vascular flow voids are maintained.

Skull and upper cervical spine: Craniocervical junction within
normal limits. Bone marrow signal intensity normal. No scalp soft
tissue abnormality.

Sinuses/Orbits: Globes and orbital soft tissues demonstrate no acute
finding. Paranasal sinuses are largely clear. No significant mastoid
effusion. Inner ear structures grossly normal.

Other: None.

MRA HEAD FINDINGS

Anterior circulation: Visualized distal cervical segments of the
internal carotid arteries are patent with antegrade flow. Petrous,
cavernous, and supraclinoid segments patent without stenosis or
other abnormality. A1 segments patent bilaterally. Right A1
hypoplastic. Normal anterior communicating artery complex. Anterior
cerebral arteries patent to their distal aspects without stenosis.
No M1 stenosis or occlusion. Normal MCA bifurcations. Distal MCA
branches well perfused and symmetric.

Posterior circulation: Visualized portions of the distal V4 segments
are widely patent. Neither PICA origin visualized. Basilar patent to
its distal aspect without stenosis. Superior cerebellar arteries
patent bilaterally. Both PCAs primarily supplied via the basilar
well perfused to their distal aspects.

No intracranial aneurysm.

Anatomic variants: Hypoplastic right A1.
IMPRESSION: MRI HEAD IMPRESSION:

1. No acute intracranial abnormality.
2. Prominence of the ventricular system at the upper portion of
cortical sulcation. While this finding could be related to atrophy,
possible NPH could also have this appearance.
3. Patchy T2/FLAIR hyperintensity involving the periventricular and
deep white matter of both cerebral hemispheres. Finding could
reflect changes of chronic microvascular ischemic disease or
possibly transependymal flow of CSF (or a combination thereof).

MRA HEAD IMPRESSION:

Normal intracranial MRA. No large vessel occlusion, hemodynamically
significant stenosis, or other acute vascular abnormality.

## 2020-11-13 MED ORDER — ATORVASTATIN CALCIUM 40 MG PO TABS: 40.0000 mg | ORAL_TABLET | Freq: Every day | ORAL | Status: DC

## 2020-11-13 MED ORDER — AMLODIPINE BESYLATE 5 MG PO TABS
5.0000 mg | ORAL_TABLET | Freq: Every day | ORAL | Status: DC
  Administered 2020-11-13 – 2020-11-14 (×2): 5 mg via ORAL
  Filled 2020-11-13 (×2): qty 1

## 2020-11-13 MED ORDER — FENOFIBRATE 160 MG PO TABS
160.0000 mg | ORAL_TABLET | Freq: Every day | ORAL | Status: DC
  Administered 2020-11-13: 160 mg via ORAL
  Filled 2020-11-13 (×2): qty 1

## 2020-11-13 NOTE — Progress Notes (Signed)
PT Cancellation & Discharge Note  Patient Details Name: Richard Vaughan MRN: [id number] DOB: [birth year]   Cancelled Treatment:    Reason Eval/Treat Not Completed: Other (comment) (OT notified PT pt is back at baseline with no functional deficits, no needs identified, will sign off). Please re-consult PT if there are any changes in pt's functional status.   Raymond Gurney, PT, DPT Acute Rehabilitation Services  Pager: [phone] Office: [phone]    Jewel Baize 11/13/2020, 11:11 AM

## 2020-11-13 NOTE — ED Notes (Addendum)
Pt provided meal tray. Pt eating lunch at this time.

## 2020-11-13 NOTE — Progress Notes (Addendum)
Stroke Neurology Progress Note  IDENTIFYING INFORMATION  Richard Vaughan MR# [id number] 11/13/2020  HISTORY OF PRESENT ILLNESS  Richard Vaughan is a 60 y.o. male who  has a past medical history of Arthropathic psoriasis (HCC), Chronic pain syndrome, and HTN (hypertension). Recurrent left-sided paresthesias last known well of 1600 on 11/12/2020. He was a difficult historian and LKW not clear therefore not a candidate for tPA.   INTERVAL HISTORY   He sates his symptoms have resolved and he is back to baseline. He does not have any further complaints.   MEDICATIONS        Current Facility-Administered Medications (Respiratory):    albuterol (PROVENTIL) (2.5 MG/3ML) 0.083% nebulizer solution 3 mL  Current Outpatient Medications (Respiratory):    albuterol (VENTOLIN HFA) 108 (90 Base) MCG/ACT inhaler, Inhale 2 puffs into the lungs every 4 (four) hours as needed. (Patient not taking: No sig reported)  Current Facility-Administered Medications (Analgesics):    acetaminophen (TYLENOL) tablet 1,000 mg  Current Outpatient Medications (Analgesics):    acetaminophen (TYLENOL) 500 MG tablet, Take 1,000 mg by mouth every 6 (six) hours as needed for headache (pain).  Current Facility-Administered Medications (Hematological):    clopidogrel (PLAVIX) tablet 75 mg   enoxaparin (LOVENOX) injection 40 mg   Current Facility-Administered Medications (Other):    hydrocortisone 2.5 % cream 1 application   LORazepam (ATIVAN) injection 1 mg   pantoprazole (PROTONIX) EC tablet 20 mg   sodium chloride flush (NS) 0.9 % injection 3 mL  Current Outpatient Medications (Other):    hydrocortisone 2.5 % cream, Apply 1 application topically daily.   lansoprazole (PREVACID) 15 MG capsule, Take 30 mg by mouth daily before breakfast.  VITAL SIGNS  Temp:  [98.5 F (36.9 C)] 98.5 F (36.9 C) (06/11 1830) Pulse Rate:  [60-91] 84 (06/12 0800) Resp:  [14-28] 21 (06/12 0800) BP: (153-175)/(78-106) 157/89 (06/12  0800) SpO2:  [96 %-100 %] 98 % (06/12 0800) Weight:  [100.6 kg] 100.6 kg (06/11 1831)  PHYSICAL EXAM   General Physical Exam  General: NAD, lying comfortably in bed HENT: Normal oropharynx and mucosa. Normal external appearance of ears and nose. CV/Chest: No JVD, normal S1S2 Lungs: No audible wheezing. Normal work of breathing. No accessory muscle use Abdomen: Non distended, non tender Extremities: Warm and well perfused. No appreciable edema, cyanosis or deformity. Skin: No rash. Normal palpation of skin.   Musculoskeletal: No joint tenderness. Normal digits and nails by inspection. No clubbing.  Neurologic Examination   Mental status/Cognition: alert; oriented to month and age; good attention; no apparent neglect Speech/language: fluent; comprehension intact; object naming intact; repetition intact Visual Fields are full. Pupils are equal, round, and reactive to light. EOMI without ptosis or diploplia.  Facial sensation is symmetric to temperature Facial movement is symmetric.  Hearing is intact to voice. Uvula midline and palate elevates symmetrically. Shoulder shrug is symmetric. Tongue is midline without atrophy or fasciculations.  Tone is normal. Bulk is normal. 5/5 strength was present in all four extremities. Sensation is symmetric to light touch and temperature in the arms and legs. Deep Tendon Reflexes: 2+ and symmetric in the biceps and patellae. Toes are downgoing bilaterally. FNF and HKS are intact bilaterally. Rapid alternating movements are normal without dysdiadochokinesia  IMAGING/DIAGNOSTIC STUDIES   CT head: There is no acute intracranial hemorrhage or evidence of acute infarction. ASPECT score is 10.  MRI brain: 1. No acute intracranial abnormality. 2. Prominence of the ventricular system at the upper portion of cortical sulcation. While this finding could  be related to atrophy, possible NPH could also have this appearance. 3. Patchy T2/FLAIR  hyperintensity involving the periventricular and deep white matter of both cerebral hemispheres. Finding could reflect changes of chronic microvascular ischemic disease or possibly transependymal flow of CSF (or a combination thereof).  MRA brain: Normal intracranial MRA. No large vessel occlusion, hemodynamically significant stenosis, or other acute vascular abnormality.  No results found for: HGBA1C Lab Results  Component Value Date   LDLCALC UNABLE TO CALCULATE IF TRIGLYCERIDE OVER 400 mg/dL 90/30/0923  Results for Richard Vaughan (MRN [id number]) as of 11/13/2020 16:17  Ref. Range 11/13/2020 03:59  Direct LDL Latest Ref Range: 0 - 99 mg/dL 33.5     ASSESSMENT AND PLAN  ?raig Vaughan is a 60 y.o. male hx HTN who presents with transient left sided weakness now back to baseline. He says that he does not have regular medical care and he is certain that he has uncontrolled HTN. He states that he does not take care of his health and though he knows he has HTN he never measures his pressures. Labs show hypertriglyceridemia.  - Etiology by TOAST Criteria - cardioembolic versus large vessel atheroembolism - CT head: no acute ischemic changes ASPECTS 10. - MRI: did not reveal acute ischemic changes however  - MRA head and neck did not show large vessel occlusion or flow limiting stenosis. - BP: Gradually normalize in 5-7 days to goal <140/90 unless A1c suggests DM2 then <130/90. - LDL: 66. - A1C: Pending - TTE: EF 60-65% no wall motion abnormality, no mention of LVT or PFO. - Statin therapy: Start high intensity statin. - Antiplatelet therapy: Clopidogrel 75mg  daily as he is allergic to ASA   -NIHSS on admission 7 -Patient has been started on Mechanical (SCD's) and Pharmacological (SQ heparin/Lovenox) DVT prophylaxis. -Dysphagia screening ordered, and will be completed prior to patient receiving oral intake. -Stroke education booklet has been ordered and will be provided by the RN that includes  both written and verbal education to the patient and family regarding ischemic strokes. We have reviewed the patient's personal modifiable risk factors. -Patient is being assessed for Rehab by PT/OT/Speech and PM&R if indicated.  Electronically signed by:  , MD Page: Marisue Humble 11/13/2020, 10:21 AM

## 2020-11-13 NOTE — Evaluation (Signed)
Occupational Therapy Evaluation and Discharge Patient Details Name: Richard Vaughan MRN: [id number] DOB: 1960/10/12 Today's Date: 11/13/2020    History of Present Illness Richard Vaughan is a 60 y.o. male presented to ED for L sided numbness / weakness onset located in face, arm, and leg. CT: negative; MRI: No acute intracranial abnormality; prominence of the ventricular system at the upper portion of  cortical sulcation. While this finding could be related to atrophy,  possible NPH could also have this appearance; Patchy T2/FLAIR hyperintensity involving the periventricular and  deep white matter of both cerebral hemispheres. Finding could reflect changes of chronic microvascular ischemic disease or possibly transependymal flow of CSF (or a combination thereof). PHMx: HTN, psoriasis / psoriatic arthritis, marijuana; RA and perpheral neuropathy per pt   Clinical Impression   This 60 yo male admitted with above presents to acute OT with PLOF of being totally independent/Mod I with basic ADLs, IADLs, and driving despite his severally RA hands.  Currently he is back to his baseline for all activities in this hospital environment (walking in hallways with balance challenges, getting out of bed, sit<>stand from chair, donning socks and bedroom slippers).  He did report upon initially sitting up that he was dizzy (from wax in his left ear that got pushed down into ear canal when ear plugs were put in for MRI, later in session he said his ear popped and he was no longer dizzy). No further OT needs and no PT needs identified (I have let them know). Acute OT will sign off.    Follow Up Recommendations  No OT follow up    Equipment Recommendations  None recommended by OT           Precautions / Restrictions Precautions Precautions: None Restrictions Weight Bearing Restrictions: No      Mobility Bed Mobility Overal bed mobility: Independent                  Transfers Overall transfer level:  Independent               General transfer comment: Pt able to walk around unit looking up,down, left, right and turning 180 degrees without LOB.    Balance Overall balance assessment: No apparent balance deficits (not formally assessed)                                         ADL either performed or assessed with clinical judgement   ADL Overall ADL's : Modified independent                                             Vision Baseline Vision/History: Wears glasses Additional Comments: Was all over the place when asking him about glasses, says he needs them for reading and driving at night but needs new ones; he did seem to not see item coming around perpherially in his left upper quadrant of left eye as quickly as he did in left lower quadrant of left eye. He did not have any issues noted in testing of right eye.            Pertinent Vitals/Pain Pain Assessment: Faces Faces Pain Scale: Hurts a little bit Pain Location: low back (normal for him per his report) Pain Descriptors / Indicators: Aching;Sore Pain  Intervention(s): Limited activity within patient's tolerance;Monitored during session (reported he did not want to try and taking anything more for pain for another hour.)     Hand Dominance Right   Extremity/Trunk Assessment Upper Extremity Assessment Upper Extremity Assessment: RUE deficits/detail;LUE deficits/detail RUE Deficits / Details: RA in hands with some movement of digits on this hand and minimal movement in wrist, other joints WNL for AROM RUE Sensation: history of peripheral neuropathy RUE Coordination: decreased fine motor LUE Deficits / Details: RA in hands with minimal movement in these fingers and minimal movement in wrist, elbow WNL, shoulder decreased shoulder flexion (old injury per pt) LUE Sensation: decreased light touch;history of peripheral neuropathy (reports mild numbness still present in LUE (but not like it  was); also in LLE and left side of lips.) LUE Coordination: decreased gross motor;decreased fine motor           Communication Communication Communication: No difficulties   Cognition Arousal/Alertness: Awake/alert Behavior During Therapy: WFL for tasks assessed/performed Overall Cognitive Status: Within Functional Limits for tasks assessed                                 General Comments: Very tangental speech, he reports he as attention deficit disorder              Home Living Family/patient expects to be discharged to:: Private residence Living Arrangements: Alone Available Help at Discharge: Friend(s);Available PRN/intermittently Type of Home: House Home Access: Ramped entrance     Home Layout: One level         Bathroom Toilet: Standard     Home Equipment: Art gallery manager          Prior Functioning/Environment Level of Independence: Independent        Comments: Was pushing a self propelled mower the day before coming in, drives himself        OT Problem List: Impaired sensation;Pain      OT Treatment/Interventions:      OT Goals(Current goals can be found in the care plan section) Acute Rehab OT Goals Patient Stated Goal: to go home   AM-PAC OT "6 Clicks" Daily Activity     Outcome Measure Help from another person eating meals?: None Help from another person taking care of personal grooming?: None Help from another person toileting, which includes using toliet, bedpan, or urinal?: None Help from another person bathing (including washing, rinsing, drying)?: None Help from another person to put on and taking off regular upper body clothing?: None Help from another person to put on and taking off regular lower body clothing?: None 6 Click Score: 24   End of Session Equipment Utilized During Treatment: Gait belt Nurse Communication: Mobility status (chat texted her and she saw him walking around the unit with me)  Activity  Tolerance: Patient tolerated treatment well Patient left: in chair;with call bell/phone within reach  OT Visit Diagnosis: Pain;Other (comment) (decreased sensation on left side) Pain - part of body:  (low back)                Time: 6578-4696 OT Time Calculation (min): 46 min Charges:  OT General Charges $OT Visit: 1 Visit OT Evaluation $OT Eval Moderate Complexity: 1 Mod OT Treatments $Self Care/Home Management : 23-37 mins  Ignacia Palma, OTR/L Acute [employer] Pager [phone] Office [phone]    Evette Georges 11/13/2020, 9:18 AM

## 2020-11-13 NOTE — Progress Notes (Signed)
PROGRESS NOTE    Richard Vaughan  DTO:[id number] DOB: 1960/12/11 DOA: 11/12/2020 PCP: Pcp, No   Chief Complain: Left-sided numbness/weakness  Brief Narrative: Patient is a 60 year old male with history of hypertension, psoriasis/psoriatic arthritis who presents to the emergency department with complaints of left-sided numbness/weakness mainly in the face, arm, leg.  On presentation he was hypertensive.  CT head did not show any acute intracranial abnormalities.  Patient was admitted for the suspicion of stroke/TIA.  Neurology consulted.  MRI of the brain did not show any acute stroke.  Pending stroke work-up including echo.  Assessment & Plan:   Principal Problem:   Acute ischemic stroke (HCC) Active Problems:   HTN (hypertension)  TIA/CVA: Presented with left-sided numbness/weakness.  Currently the symptoms have resolved.  Symptoms might be consistent with TIA. CT head did not show any acute intracranial abnormalities so as MRI.  MRA did not show any large vessel occlusion or significant stenosis.  Carotid Doppler did not show any significant stenosis. Echocardiogram is pending.  PT/OT did not recommend any follow-up.  Lipid panel showed high level of triglyceride which is a chronic problem.  We will repeat lipid panel tomorrow.  Hemoglobin A1c is pending. Neurology recommended to start on Plavix 75 mg daily since he is allergic to aspirin.  Hypertension: Med rec does not show any medications that he was taking at home for hypertension.  Patient is noncompliant and has not seen a physician for a while.Marland Kitchen  Hypertensive during my evaluation.  Started on amlodipine 5 mg daily.  Hypertriglyceridemia: Known history.  Not does not take any medication at home.  Will start on fenofibrate.  Psoriasis/psoriatic arthritis: Continue supportive care.  Follow-up with hematology as an outpatient          DVT prophylaxis:Lovenox Code Status: Full Family Communication: None at bedside Status is:  Observation  The patient remains OBS appropriate and will d/c before 2 midnights.  Dispo: The patient is from: Home              Anticipated d/c is to: Home              Patient currently is not medically stable to d/c.   Difficult to place patient No     Consultants: Neurology  Procedures:MRI  Antimicrobials:  Anti-infectives (From admission, onward)    None       Subjective:  Patient seen and examined at the bedside this afternoon.  He was hypertensive when I entered the room.  He denies any left-sided weakness that he had earlier.  No tingling or numbness.  Alert and oriented.  He has no  new complaints   Objective: Vitals:   11/13/20 0741 11/13/20 0800 11/13/20 1100 11/13/20 1200  BP:  (!) 157/89 (!) 167/88 (!) 145/87  Pulse: 66 84 63 60  Resp: 19 (!) 21 (!) 22 19  Temp:      SpO2: 100% 98% 97% 97%  Weight:       No intake or output data in the 24 hours ending 11/13/20 1354 Filed Weights   11/12/20 1831  Weight: 100.6 kg    Examination:  General exam: Appears calm and comfortable ,Not in distress, obese HEENT:PERRL,Oral mucosa moist, Ear/Nose normal on gross exam Respiratory system: Bilateral equal air entry, normal vesicular breath sounds, no wheezes or crackles  Cardiovascular system: S1 & S2 heard, RRR. No JVD, murmurs, rubs, gallops or clicks. No pedal edema. Gastrointestinal system: Abdomen is nondistended, soft and nontender. No organomegaly or masses felt. Normal  bowel sounds heard. Central nervous system: Alert and oriented. No focal neurological deficits. Extremities: No edema, no clubbing ,no cyanosis Deformities of the fingers from psoriatic arthritis Skin: Psoriatic rash, no ulcers      Data Reviewed: I have personally reviewed following labs and imaging studies  CBC: Recent Labs  Lab 11/12/20 1837 11/12/20 1850  WBC 9.1  --   NEUTROABS 5.3  --   HGB 12.5* 12.9*  HCT 39.3 38.0*  MCV 84.9  --   PLT 348  --    Basic Metabolic  Panel: Recent Labs  Lab 11/12/20 1837 11/12/20 1850  NA 134* 136  K 3.7 3.8  CL 102 104  CO2 21*  --   GLUCOSE 128* 130*  BUN 5* 6*  CREATININE 0.89 0.80  CALCIUM 8.7*  --    GFR: CrCl cannot be calculated (Unknown ideal weight.). Liver Function Tests: Recent Labs  Lab 11/12/20 1837  AST 25  ALT 21  ALKPHOS 83  BILITOT 0.3  PROT 7.8  ALBUMIN 3.5   No results for input(s): LIPASE, AMYLASE in the last 168 hours. No results for input(s): AMMONIA in the last 168 hours. Coagulation Profile: Recent Labs  Lab 11/12/20 1837  INR 1.0   Cardiac Enzymes: No results for input(s): CKTOTAL, CKMB, CKMBINDEX, TROPONINI in the last 168 hours. BNP (last 3 results) No results for input(s): PROBNP in the last 8760 hours. HbA1C: No results for input(s): HGBA1C in the last 72 hours. CBG: Recent Labs  Lab 11/12/20 1832  GLUCAP 121*   Lipid Profile: Recent Labs    11/13/20 0359  CHOL 169  HDL 22*  LDLCALC UNABLE TO CALCULATE IF TRIGLYCERIDE OVER 400 mg/dL  TRIG 782*  CHOLHDL 7.7   Thyroid Function Tests: No results for input(s): TSH, T4TOTAL, FREET4, T3FREE, THYROIDAB in the last 72 hours. Anemia Panel: No results for input(s): VITAMINB12, FOLATE, FERRITIN, TIBC, IRON, RETICCTPCT in the last 72 hours. Sepsis Labs: No results for input(s): PROCALCITON, LATICACIDVEN in the last 168 hours.  Recent Results (from the past 240 hour(s))  SARS CORONAVIRUS 2 (TAT 6-24 HRS) Nasopharyngeal Nasopharyngeal Swab     Status: None   Collection Time: 11/13/20 12:04 AM   Specimen: Nasopharyngeal Swab  Result Value Ref Range Status   SARS Coronavirus 2 NEGATIVE NEGATIVE Final    Comment: (NOTE) SARS-CoV-2 target nucleic acids are NOT DETECTED.  The SARS-CoV-2 RNA is generally detectable in upper and lower respiratory specimens during the acute phase of infection. Negative results do not preclude SARS-CoV-2 infection, do not rule out co-infections with other pathogens, and should not  be used as the sole basis for treatment or other patient management decisions. Negative results must be combined with clinical observations, patient history, and epidemiological information. The expected result is Negative.  Fact Sheet for Patients: HairSlick.no  Fact Sheet for Healthcare Providers: quierodirigir.com  This test is not yet approved or cleared by the [place] FDA and  has been authorized for detection and/or diagnosis of SARS-CoV-2 by FDA under an Emergency Use Authorization (EUA). This EUA will remain  in effect (meaning this test can be used) for the duration of the COVID-19 declaration under Se ction 564(b)(1) of the Act, 21 U.S.C. section 360bbb-3(b)(1), unless the authorization is terminated or revoked sooner.  Performed at [facility], [address] [place], [place] [address]          Radiology Studies: MR ANGIO HEAD WO CONTRAST  Result Date: 11/13/2020 CLINICAL DATA:  Initial evaluation  for acute neuro deficit, stroke suspected. EXAM: MRI HEAD WITHOUT CONTRAST MRA HEAD WITHOUT CONTRAST TECHNIQUE: Multiplanar, multi-echo pulse sequences of the brain and surrounding structures were acquired without intravenous contrast. Angiographic images of the Circle of Willis were acquired using MRA technique without intravenous contrast. COMPARISON: No pertinent prior exam. COMPARISON:  Prior head CT from 11/12/2020. FINDINGS: MRI HEAD FINDINGS Brain: Cerebral volume within normal limits for age. Patchy T2/FLAIR hyperintensity involving the periventricular deep white matter both cerebral hemispheres could reflect chronic microvascular ischemic disease or possibly transependymal flow of CSF (or a combination there of). No abnormal foci of restricted diffusion to suggest acute or subacute ischemia. Gray-white matter differentiation maintained. No encephalomalacia suggest chronic cortical infarction. No evidence  for acute or chronic intracranial hemorrhage. No mass lesion, midline shift or mass effect. Ventricles are somewhat prominent in at upper portion of cortical sulcation. While this finding could be related to atrophy, possible in pH could also have this appearance. Pituitary gland and suprasellar region within normal limits. Midline structures intact. No extra-axial collection. Vascular: Major intracranial vascular flow voids are maintained. Skull and upper cervical spine: Craniocervical junction within normal limits. Bone marrow signal intensity normal. No scalp soft tissue abnormality. Sinuses/Orbits: Globes and orbital soft tissues demonstrate no acute finding. Paranasal sinuses are largely clear. No significant mastoid effusion. Inner ear structures grossly normal. Other: None. MRA HEAD FINDINGS Anterior circulation: Visualized distal cervical segments of the internal carotid arteries are patent with antegrade flow. Petrous, cavernous, and supraclinoid segments patent without stenosis or other abnormality. A1 segments patent bilaterally. Right A1 hypoplastic. Normal anterior communicating artery complex. Anterior cerebral arteries patent to their distal aspects without stenosis. No M1 stenosis or occlusion. Normal MCA bifurcations. Distal MCA branches well perfused and symmetric. Posterior circulation: Visualized portions of the distal V4 segments are widely patent. Neither PICA origin visualized. Basilar patent to its distal aspect without stenosis. Superior cerebellar arteries patent bilaterally. Both PCAs primarily supplied via the basilar well perfused to their distal aspects. No intracranial aneurysm. Anatomic variants: Hypoplastic right A1. IMPRESSION: MRI HEAD IMPRESSION: 1. No acute intracranial abnormality. 2. Prominence of the ventricular system at the upper portion of cortical sulcation. While this finding could be related to atrophy, possible NPH could also have this appearance. 3. Patchy T2/FLAIR  hyperintensity involving the periventricular and deep white matter of both cerebral hemispheres. Finding could reflect changes of chronic microvascular ischemic disease or possibly transependymal flow of CSF (or a combination thereof). MRA HEAD IMPRESSION: Normal intracranial MRA. No large vessel occlusion, hemodynamically significant stenosis, or other acute vascular abnormality. Electronically Signed   By: Rise Mu M.D.   On: 11/13/2020 03:17   MR BRAIN WO CONTRAST  Result Date: 11/13/2020 CLINICAL DATA:  Initial evaluation for acute neuro deficit, stroke suspected. EXAM: MRI HEAD WITHOUT CONTRAST MRA HEAD WITHOUT CONTRAST TECHNIQUE: Multiplanar, multi-echo pulse sequences of the brain and surrounding structures were acquired without intravenous contrast. Angiographic images of the Circle of Willis were acquired using MRA technique without intravenous contrast. COMPARISON: No pertinent prior exam. COMPARISON:  Prior head CT from 11/12/2020. FINDINGS: MRI HEAD FINDINGS Brain: Cerebral volume within normal limits for age. Patchy T2/FLAIR hyperintensity involving the periventricular deep white matter both cerebral hemispheres could reflect chronic microvascular ischemic disease or possibly transependymal flow of CSF (or a combination there of). No abnormal foci of restricted diffusion to suggest acute or subacute ischemia. Gray-white matter differentiation maintained. No encephalomalacia suggest chronic cortical infarction. No evidence for acute or chronic intracranial hemorrhage. No mass  lesion, midline shift or mass effect. Ventricles are somewhat prominent in at upper portion of cortical sulcation. While this finding could be related to atrophy, possible in pH could also have this appearance. Pituitary gland and suprasellar region within normal limits. Midline structures intact. No extra-axial collection. Vascular: Major intracranial vascular flow voids are maintained. Skull and upper cervical  spine: Craniocervical junction within normal limits. Bone marrow signal intensity normal. No scalp soft tissue abnormality. Sinuses/Orbits: Globes and orbital soft tissues demonstrate no acute finding. Paranasal sinuses are largely clear. No significant mastoid effusion. Inner ear structures grossly normal. Other: None. MRA HEAD FINDINGS Anterior circulation: Visualized distal cervical segments of the internal carotid arteries are patent with antegrade flow. Petrous, cavernous, and supraclinoid segments patent without stenosis or other abnormality. A1 segments patent bilaterally. Right A1 hypoplastic. Normal anterior communicating artery complex. Anterior cerebral arteries patent to their distal aspects without stenosis. No M1 stenosis or occlusion. Normal MCA bifurcations. Distal MCA branches well perfused and symmetric. Posterior circulation: Visualized portions of the distal V4 segments are widely patent. Neither PICA origin visualized. Basilar patent to its distal aspect without stenosis. Superior cerebellar arteries patent bilaterally. Both PCAs primarily supplied via the basilar well perfused to their distal aspects. No intracranial aneurysm. Anatomic variants: Hypoplastic right A1. IMPRESSION: MRI HEAD IMPRESSION: 1. No acute intracranial abnormality. 2. Prominence of the ventricular system at the upper portion of cortical sulcation. While this finding could be related to atrophy, possible NPH could also have this appearance. 3. Patchy T2/FLAIR hyperintensity involving the periventricular and deep white matter of both cerebral hemispheres. Finding could reflect changes of chronic microvascular ischemic disease or possibly transependymal flow of CSF (or a combination thereof). MRA HEAD IMPRESSION: Normal intracranial MRA. No large vessel occlusion, hemodynamically significant stenosis, or other acute vascular abnormality. Electronically Signed   By: Rise Mu M.D.   On: 11/13/2020 03:17   CT HEAD  CODE STROKE WO CONTRAST  Result Date: 11/12/2020 CLINICAL DATA:  Code stroke.  Left facial droop and numbness EXAM: CT HEAD WITHOUT CONTRAST TECHNIQUE: Contiguous axial images were obtained from the base of the skull through the vertex without intravenous contrast. COMPARISON:  None. FINDINGS: Brain: There is no acute intracranial hemorrhage, mass effect, or edema. Gray-white differentiation is preserved. Patchy hypoattenuation in the supratentorial white matter is nonspecific but may reflect mild to moderate chronic microvascular ischemic changes. There is disproportionate prominence of the ventricles compared to the sulci. No extra-axial fluid collection. Vascular: No hyperdense vessel. Skull: Unremarkable. Sinuses/Orbits: No acute abnormality. Other: Head mastoid air cells are clear. ASPECTS ([place] Stroke Program Early CT Score) - Ganglionic level infarction (caudate, lentiform nuclei, internal capsule, insula, M1-M3 cortex): 7 - Supraganglionic infarction (M4-M6 cortex): 3 Total score (0-10 with 10 being normal): 10 IMPRESSION: There is no acute intracranial hemorrhage or evidence of acute infarction. ASPECT score is 10. Disproportionate prominence of the ventricles. May reflect central cerebral volume loss or communicating (normal pressure) hydrocephalus in the appropriate setting. Mild to moderate chronic microvascular ischemic changes. Preliminary results were communicated to Dr. Selina Cooley at 6:46 pm on 11/12/2020 by text page via the [facility] messaging system. Electronically Signed   By: Guadlupe Spanish M.D.   On: 11/12/2020 18:48   VAS US CAROTID  Result Date: 11/13/2020 Carotid Arterial Duplex Study Patient Name:  TIRAS BIANCHINI  Date of Exam:   11/13/2020 Medical Rec #: [id number]     Accession #:    [id number] Date of Birth: 1960-07-30     Patient Gender: Judie Petit  Patient Age:   3Y Exam Location:  [facility] Procedure:      VAS US CAROTID Referring Phys: [id number] JARED M GARDNER  --------------------------------------------------------------------------------  Indications:       Numbness and Weakness. Risk Factors:      Hypertension. Limitations        Today's exam was limited due to patient movement/talking. Comparison Study:  No prior study Performing Technologist: Sherren Kerns RVS  Examination Guidelines: A complete evaluation includes B-mode imaging, spectral Doppler, color Doppler, and power Doppler as needed of all accessible portions of each vessel. Bilateral testing is considered an integral part of a complete examination. Limited examinations for reoccurring indications may be performed as noted.  Right Carotid Findings: +----------+--------+--------+--------+------------------+------------------+           PSV cm/sEDV cm/sStenosisPlaque DescriptionComments           +----------+--------+--------+--------+------------------+------------------+ CCA Prox  100     22                                intimal thickening +----------+--------+--------+--------+------------------+------------------+ CCA Distal107     19                                intimal thickening +----------+--------+--------+--------+------------------+------------------+ ICA Prox  88      22                                                   +----------+--------+--------+--------+------------------+------------------+ ICA Distal92      27                                                   +----------+--------+--------+--------+------------------+------------------+ ECA       112     16                                                   +----------+--------+--------+--------+------------------+------------------+ +----------+--------+-------+--------------+-------------------+           PSV cm/sEDV cmsDescribe      Arm Pressure (mmHG) +----------+--------+-------+--------------+-------------------+ Subclavian               Not identified                     +----------+--------+-------+--------------+-------------------+ +---------+--------+--+--------+--+---------+ VertebralPSV cm/s81EDV cm/s27Antegrade +---------+--------+--+--------+--+---------+  Left Carotid Findings: +----------+--------+--------+--------+------------------+------------------+           PSV cm/sEDV cm/sStenosisPlaque DescriptionComments           +----------+--------+--------+--------+------------------+------------------+ CCA Prox  104     25                                intimal thickening +----------+--------+--------+--------+------------------+------------------+ CCA Distal95      22                                intimal thickening +----------+--------+--------+--------+------------------+------------------+ ICA  Prox  86      20                                                   +----------+--------+--------+--------+------------------+------------------+ ICA Distal153     39                                                   +----------+--------+--------+--------+------------------+------------------+ ECA       123     20                                                   +----------+--------+--------+--------+------------------+------------------+ +----------+--------+--------+--------+-------------------+           PSV cm/sEDV cm/sDescribeArm Pressure (mmHG) +----------+--------+--------+--------+-------------------+ [id number]                                          +----------+--------+--------+--------+-------------------+ +---------+--------+--+--------+--+---------+ VertebralPSV cm/s51EDV cm/s20Antegrade +---------+--------+--+--------+--+---------+   Summary: Right Carotid: The extracranial vessels were near-normal with only minimal wall                thickening or plaque. Left Carotid: The extracranial vessels were near-normal with only minimal wall               thickening or plaque. Vertebrals:  Bilateral  vertebral arteries demonstrate antegrade flow. Subclavians: Normal flow hemodynamics were seen in bilateral subclavian              arteries. *See table(s) above for measurements and observations.     Preliminary         Scheduled Meds:  clopidogrel  75 mg Oral Daily   enoxaparin (LOVENOX) injection  40 mg Subcutaneous Q24H   hydrocortisone  1 application Topical Daily   LORazepam  1 mg Intravenous Once   pantoprazole  20 mg Oral Daily   sodium chloride flush  3 mL Intravenous Once   Continuous Infusions:   LOS: 0 days    Time spent: 25 mins.More than 50% of that time was spent in counseling and/or coordination of care.      Burnadette PopAmrit Yarelin Reichardt, MD [facility] P6/05/2021, 1:54 PM

## 2020-11-13 NOTE — Progress Notes (Signed)
VASCULAR LAB    Carotid duplex has been performed.  See CV proc for preliminary results.   Katurah Karapetian, RVT 11/13/2020, 12:54 PM

## 2020-11-14 DIAGNOSIS — G459 Transient cerebral ischemic attack, unspecified: Secondary | ICD-10-CM

## 2020-11-14 DIAGNOSIS — I10 Essential (primary) hypertension: Secondary | ICD-10-CM | POA: Diagnosis not present

## 2020-11-14 LAB — RAPID URINE DRUG SCREEN, HOSP PERFORMED
Amphetamines: NOT DETECTED
Barbiturates: NOT DETECTED
Benzodiazepines: NOT DETECTED
Cocaine: NOT DETECTED
Opiates: NOT DETECTED
Tetrahydrocannabinol: NOT DETECTED

## 2020-11-14 LAB — LIPID PANEL
Cholesterol: 203 mg/dL — ABNORMAL HIGH (ref 0–200)
HDL: 24 mg/dL — ABNORMAL LOW (ref >40)
LDL Cholesterol: UNDETERMINED mg/dL (ref 0–99)
Total CHOL/HDL Ratio: 8.5 RATIO
Triglycerides: 899 mg/dL — ABNORMAL HIGH (ref <150)
VLDL: UNDETERMINED mg/dL (ref 0–40)

## 2020-11-14 LAB — HEMOGLOBIN A1C
Hgb A1c MFr Bld: 6 % — ABNORMAL HIGH (ref 4.8–5.6)
Mean Plasma Glucose: 126 mg/dL

## 2020-11-14 LAB — LDL CHOLESTEROL, DIRECT: Direct LDL: 56.6 mg/dL (ref 0–99)

## 2020-11-14 MED ORDER — FENOFIBRATE 54 MG PO TABS
54.0000 mg | ORAL_TABLET | Freq: Every day | ORAL | Status: DC
  Filled 2020-11-14: qty 1

## 2020-11-14 MED ORDER — ATORVASTATIN CALCIUM 80 MG PO TABS: 80.0000 mg | ORAL_TABLET | Freq: Every day | ORAL | 1 refills | Status: AC

## 2020-11-14 MED ORDER — AMLODIPINE BESYLATE 10 MG PO TABS: 10.0000 mg | ORAL_TABLET | Freq: Every day | ORAL | 11 refills | Status: AC

## 2020-11-14 MED ORDER — FENOFIBRATE 54 MG PO TABS: 54.0000 mg | ORAL_TABLET | Freq: Every day | ORAL | 1 refills | Status: AC

## 2020-11-14 MED ORDER — ATORVASTATIN CALCIUM 80 MG PO TABS
80.0000 mg | ORAL_TABLET | Freq: Every day | ORAL | Status: DC
  Administered 2020-11-14: 80 mg via ORAL
  Filled 2020-11-14: qty 1

## 2020-11-14 MED ORDER — CLOPIDOGREL BISULFATE 75 MG PO TABS
75.0000 mg | ORAL_TABLET | Freq: Every day | ORAL | 1 refills | Status: AC
Start: 2020-11-15 — End: ?

## 2020-11-14 NOTE — Progress Notes (Signed)
PROGRESS NOTE    Nayden Czajka  WUJ:[id number] DOB: Oct 15, 1960 DOA: 11/12/2020 PCP: Pcp, No   Chief Complain: Left-sided numbness/weakness  Brief Narrative: Patient is a 60 year old male with history of hypertension, psoriasis/psoriatic arthritis who presents to the emergency department with complaints of left-sided numbness/weakness mainly in the face, arm, leg.  On presentation he was hypertensive.  CT head did not show any acute intracranial abnormalities.  Patient was admitted for the suspicion of stroke/TIA.  Neurology consulted.  MRI of the brain did not show any acute stroke.  Pending stroke work-up including echo.  Assessment & Plan:   Principal Problem:   Acute ischemic stroke (HCC) Active Problems:   HTN (hypertension)  TIA: Presented with left-sided numbness/weakness.  Currently the symptoms have resolved.  Symptoms might be consistent with TIA. CT head did not show any acute intracranial abnormalities so as MRI.  MRA did not show any large vessel occlusion or significant stenosis.  Carotid Doppler did not show any significant stenosis. TTE w/ mild LVH, o/w wnl. Pt does have longstanding untreated hypertension. PT/OT did not recommend any follow-up.   Hemoglobin A1c is pending. Neurology recommended to start on Plavix 75 mg daily since he is allergic to aspirin. - cont atorva/plavix - f/u neuro recs  Hypertension: Med rec does not show any medications that he was taking at home for hypertension.  Patient is noncompliant and has not seen a physician for a while..  bp moderately elevated - started on amlodipine 5 mg daily.  Psoriasis/psoriatic arthritis: Continue supportive care.  Follow-up with hematology as an outpatient  Diarrhea - one day watery non-bloody diarrhea. No vomiting. No abd pain - will check c diff          DVT prophylaxis:Lovenox Code Status: Full Family Communication: None at bedside Status is: Observation  The patient remains OBS appropriate and  will d/c before 2 midnights.  Dispo: The patient is from: Home              Anticipated d/c is to: Home              Patient currently is not medically stable to d/c.   Difficult to place patient No     Consultants: Neurology  Procedures:MRI  Antimicrobials:  Anti-infectives (From admission, onward)    None       Subjective:  Patient seen and examined at the bedside this afternoon.  He was hypertensive when I entered the room.  He denies any left-sided weakness that he had earlier.  No tingling or numbness.  Alert and oriented.  He has no  new complaints   Objective: Vitals:   11/13/20 1831 11/13/20 2013 11/13/20 2358 11/14/20 0439  BP: (!) 175/82 (!) 164/86 140/82 (!) 159/84  Pulse: 69 69 68 66  Resp: Temp: (!) 97.4 F (36.3 C) 97.8 F (36.6 C) 98.2 F (36.8 C) 97.6 F (36.4 C)  TempSrc: Oral Oral Oral Oral  SpO2: 98% 98% 98% 97%  Weight:       No intake or output data in the 24 hours ending 11/14/20 0807 Filed Weights   11/12/20 1831  Weight: 100.6 kg    Examination:  General exam: Appears calm and comfortable ,Not in distress, obese HEENT:PERRL,Oral mucosa moist, Ear/Nose normal on gross exam Respiratory system: Bilateral equal air entry, normal vesicular breath sounds, no wheezes or crackles  Cardiovascular system: S1 & S2 heard, RRR. No JVD, murmurs, rubs, gallops or clicks. No pedal edema. Gastrointestinal system:  Abdomen is nondistended, soft and nontender. No organomegaly or masses felt. Normal bowel sounds heard. Central nervous system: Alert and oriented. No focal neurological deficits. Extremities: No edema, no clubbing ,no cyanosis Deformities of the fingers from psoriatic arthritis Skin: Psoriatic rash, no ulcers      Data Reviewed: I have personally reviewed following labs and imaging studies  CBC: Recent Labs  Lab 11/12/20 1837 11/12/20 1850  WBC 9.1  --   NEUTROABS 5.3  --   HGB 12.5* 12.9*  HCT 39.3 38.0*  MCV 84.9   --   PLT 348  --    Basic Metabolic Panel: Recent Labs  Lab 11/12/20 1837 11/12/20 1850  NA 134* 136  K 3.7 3.8  CL 102 104  CO2 21*  --   GLUCOSE 128* 130*  BUN 5* 6*  CREATININE 0.89 0.80  CALCIUM 8.7*  --    GFR: CrCl cannot be calculated (Unknown ideal weight.). Liver Function Tests: Recent Labs  Lab 11/12/20 1837  AST 25  ALT 21  ALKPHOS 83  BILITOT 0.3  PROT 7.8  ALBUMIN 3.5   No results for input(s): LIPASE, AMYLASE in the last 168 hours. No results for input(s): AMMONIA in the last 168 hours. Coagulation Profile: Recent Labs  Lab 11/12/20 1837  INR 1.0   Cardiac Enzymes: No results for input(s): CKTOTAL, CKMB, CKMBINDEX, TROPONINI in the last 168 hours. BNP (last 3 results) No results for input(s): PROBNP in the last 8760 hours. HbA1C: No results for input(s): HGBA1C in the last 72 hours. CBG: Recent Labs  Lab 11/12/20 1832  GLUCAP 121*   Lipid Profile: Recent Labs    11/13/20 0359  CHOL 169  HDL 22*  LDLCALC UNABLE TO CALCULATE IF TRIGLYCERIDE OVER 400 mg/dL  TRIG 161*  CHOLHDL 7.7  LDLDIRECT 60.3   Thyroid Function Tests: No results for input(s): TSH, T4TOTAL, FREET4, T3FREE, THYROIDAB in the last 72 hours. Anemia Panel: No results for input(s): VITAMINB12, FOLATE, FERRITIN, TIBC, IRON, RETICCTPCT in the last 72 hours. Sepsis Labs: No results for input(s): PROCALCITON, LATICACIDVEN in the last 168 hours.  Recent Results (from the past 240 hour(s))  SARS CORONAVIRUS 2 (TAT 6-24 HRS) Nasopharyngeal Nasopharyngeal Swab     Status: None   Collection Time: 11/13/20 12:04 AM   Specimen: Nasopharyngeal Swab  Result Value Ref Range Status   SARS Coronavirus 2 NEGATIVE NEGATIVE Final    Comment: (NOTE) SARS-CoV-2 target nucleic acids are NOT DETECTED.  The SARS-CoV-2 RNA is generally detectable in upper and lower respiratory specimens during the acute phase of infection. Negative results do not preclude SARS-CoV-2 infection, do not rule  out co-infections with other pathogens, and should not be used as the sole basis for treatment or other patient management decisions. Negative results must be combined with clinical observations, patient history, and epidemiological information. The expected result is Negative.  Fact Sheet for Patients: HairSlick.no  Fact Sheet for Healthcare Providers: quierodirigir.com  This test is not yet approved or cleared by the [place] FDA and  has been authorized for detection and/or diagnosis of SARS-CoV-2 by FDA under an Emergency Use Authorization (EUA). This EUA will remain  in effect (meaning this test can be used) for the duration of the COVID-19 declaration under Se ction 564(b)(1) of the Act, 21 U.S.C. section 360bbb-3(b)(1), unless the authorization is terminated or revoked sooner.  Performed at [facility], [address] [place], [place] [address]          Radiology  Studies: MR ANGIO HEAD WO CONTRAST  Result Date: 11/13/2020 CLINICAL DATA:  Initial evaluation for acute neuro deficit, stroke suspected. EXAM: MRI HEAD WITHOUT CONTRAST MRA HEAD WITHOUT CONTRAST TECHNIQUE: Multiplanar, multi-echo pulse sequences of the brain and surrounding structures were acquired without intravenous contrast. Angiographic images of the Circle of Willis were acquired using MRA technique without intravenous contrast. COMPARISON: No pertinent prior exam. COMPARISON:  Prior head CT from 11/12/2020. FINDINGS: MRI HEAD FINDINGS Brain: Cerebral volume within normal limits for age. Patchy T2/FLAIR hyperintensity involving the periventricular deep white matter both cerebral hemispheres could reflect chronic microvascular ischemic disease or possibly transependymal flow of CSF (or a combination there of). No abnormal foci of restricted diffusion to suggest acute or subacute ischemia. Gray-white matter differentiation maintained. No  encephalomalacia suggest chronic cortical infarction. No evidence for acute or chronic intracranial hemorrhage. No mass lesion, midline shift or mass effect. Ventricles are somewhat prominent in at upper portion of cortical sulcation. While this finding could be related to atrophy, possible in pH could also have this appearance. Pituitary gland and suprasellar region within normal limits. Midline structures intact. No extra-axial collection. Vascular: Major intracranial vascular flow voids are maintained. Skull and upper cervical spine: Craniocervical junction within normal limits. Bone marrow signal intensity normal. No scalp soft tissue abnormality. Sinuses/Orbits: Globes and orbital soft tissues demonstrate no acute finding. Paranasal sinuses are largely clear. No significant mastoid effusion. Inner ear structures grossly normal. Other: None. MRA HEAD FINDINGS Anterior circulation: Visualized distal cervical segments of the internal carotid arteries are patent with antegrade flow. Petrous, cavernous, and supraclinoid segments patent without stenosis or other abnormality. A1 segments patent bilaterally. Right A1 hypoplastic. Normal anterior communicating artery complex. Anterior cerebral arteries patent to their distal aspects without stenosis. No M1 stenosis or occlusion. Normal MCA bifurcations. Distal MCA branches well perfused and symmetric. Posterior circulation: Visualized portions of the distal V4 segments are widely patent. Neither PICA origin visualized. Basilar patent to its distal aspect without stenosis. Superior cerebellar arteries patent bilaterally. Both PCAs primarily supplied via the basilar well perfused to their distal aspects. No intracranial aneurysm. Anatomic variants: Hypoplastic right A1. IMPRESSION: MRI HEAD IMPRESSION: 1. No acute intracranial abnormality. 2. Prominence of the ventricular system at the upper portion of cortical sulcation. While this finding could be related to atrophy,  possible NPH could also have this appearance. 3. Patchy T2/FLAIR hyperintensity involving the periventricular and deep white matter of both cerebral hemispheres. Finding could reflect changes of chronic microvascular ischemic disease or possibly transependymal flow of CSF (or a combination thereof). MRA HEAD IMPRESSION: Normal intracranial MRA. No large vessel occlusion, hemodynamically significant stenosis, or other acute vascular abnormality. Electronically Signed   By: Rise Mu M.D.   On: 11/13/2020 03:17   MR BRAIN WO CONTRAST  Result Date: 11/13/2020 CLINICAL DATA:  Initial evaluation for acute neuro deficit, stroke suspected. EXAM: MRI HEAD WITHOUT CONTRAST MRA HEAD WITHOUT CONTRAST TECHNIQUE: Multiplanar, multi-echo pulse sequences of the brain and surrounding structures were acquired without intravenous contrast. Angiographic images of the Circle of Willis were acquired using MRA technique without intravenous contrast. COMPARISON: No pertinent prior exam. COMPARISON:  Prior head CT from 11/12/2020. FINDINGS: MRI HEAD FINDINGS Brain: Cerebral volume within normal limits for age. Patchy T2/FLAIR hyperintensity involving the periventricular deep white matter both cerebral hemispheres could reflect chronic microvascular ischemic disease or possibly transependymal flow of CSF (or a combination there of). No abnormal foci of restricted diffusion to suggest acute or subacute ischemia. Gray-white matter differentiation maintained. No  encephalomalacia suggest chronic cortical infarction. No evidence for acute or chronic intracranial hemorrhage. No mass lesion, midline shift or mass effect. Ventricles are somewhat prominent in at upper portion of cortical sulcation. While this finding could be related to atrophy, possible in pH could also have this appearance. Pituitary gland and suprasellar region within normal limits. Midline structures intact. No extra-axial collection. Vascular: Major intracranial  vascular flow voids are maintained. Skull and upper cervical spine: Craniocervical junction within normal limits. Bone marrow signal intensity normal. No scalp soft tissue abnormality. Sinuses/Orbits: Globes and orbital soft tissues demonstrate no acute finding. Paranasal sinuses are largely clear. No significant mastoid effusion. Inner ear structures grossly normal. Other: None. MRA HEAD FINDINGS Anterior circulation: Visualized distal cervical segments of the internal carotid arteries are patent with antegrade flow. Petrous, cavernous, and supraclinoid segments patent without stenosis or other abnormality. A1 segments patent bilaterally. Right A1 hypoplastic. Normal anterior communicating artery complex. Anterior cerebral arteries patent to their distal aspects without stenosis. No M1 stenosis or occlusion. Normal MCA bifurcations. Distal MCA branches well perfused and symmetric. Posterior circulation: Visualized portions of the distal V4 segments are widely patent. Neither PICA origin visualized. Basilar patent to its distal aspect without stenosis. Superior cerebellar arteries patent bilaterally. Both PCAs primarily supplied via the basilar well perfused to their distal aspects. No intracranial aneurysm. Anatomic variants: Hypoplastic right A1. IMPRESSION: MRI HEAD IMPRESSION: 1. No acute intracranial abnormality. 2. Prominence of the ventricular system at the upper portion of cortical sulcation. While this finding could be related to atrophy, possible NPH could also have this appearance. 3. Patchy T2/FLAIR hyperintensity involving the periventricular and deep white matter of both cerebral hemispheres. Finding could reflect changes of chronic microvascular ischemic disease or possibly transependymal flow of CSF (or a combination thereof). MRA HEAD IMPRESSION: Normal intracranial MRA. No large vessel occlusion, hemodynamically significant stenosis, or other acute vascular abnormality. Electronically Signed   By:  Rise Mu M.D.   On: 11/13/2020 03:17   ECHOCARDIOGRAM COMPLETE  Result Date: 11/13/2020    ECHOCARDIOGRAM REPORT   Patient Name:   SYD NEWSOME Date of Exam: 11/13/2020 Medical Rec #:  [id number]    Height: Accession #:    [id number]   Weight:       221.8 lb Date of Birth:  1960/11/14    BSA:          2.163 m Patient Age:    59 years     BP:           178/85 mmHg Patient Gender: M            HR:           62 bpm. Exam Location:  Inpatient Procedure: 2D Echo, Cardiac Doppler and Color Doppler Indications:    Stroke  History:        Patient has no prior history of Echocardiogram examinations.                 Risk Factors:Hypertension.  Sonographer:    Ross Ludwig RDCS (AE) Referring Phys: [phone] JARED M GARDNER  Sonographer Comments: Suboptimal subcostal window. IMPRESSIONS  1. Left ventricular ejection fraction, by estimation, is 60 to 65%. The left ventricle has normal function. The left ventricle has no regional wall motion abnormalities. There is mild left ventricular hypertrophy. Left ventricular diastolic parameters were normal.  2. Right ventricular systolic function is normal. The right ventricular size is normal.  3. Left atrial size was mildly dilated.  4. The mitral valve  is normal in structure. Trivial mitral valve regurgitation. No evidence of mitral stenosis.  5. The aortic valve is tricuspid. Aortic valve regurgitation is not visualized. No aortic stenosis is present.  6. The inferior vena cava is normal in size with greater than 50% respiratory variability, suggesting right atrial pressure of 3 mmHg. FINDINGS  Left Ventricle: Left ventricular ejection fraction, by estimation, is 60 to 65%. The left ventricle has normal function. The left ventricle has no regional wall motion abnormalities. The left ventricular internal cavity size was normal in size. There is  mild left ventricular hypertrophy. Left ventricular diastolic parameters were normal. Right Ventricle: The right ventricular size is  normal. No increase in right ventricular wall thickness. Right ventricular systolic function is normal. Left Atrium: Left atrial size was mildly dilated. Right Atrium: Right atrial size was normal in size. Pericardium: There is no evidence of pericardial effusion. Mitral Valve: The mitral valve is normal in structure. There is mild thickening of the mitral valve leaflet(s). Trivial mitral valve regurgitation. No evidence of mitral valve stenosis. MV peak gradient, 4.1 mmHg. The mean mitral valve gradient is 1.0 mmHg. Tricuspid Valve: The tricuspid valve is normal in structure. Tricuspid valve regurgitation is trivial. No evidence of tricuspid stenosis. Aortic Valve: The aortic valve is tricuspid. Aortic valve regurgitation is not visualized. No aortic stenosis is present. Aortic valve mean gradient measures 4.0 mmHg. Aortic valve peak gradient measures 7.5 mmHg. Aortic valve area, by VTI measures 2.16 cm. Pulmonic Valve: The pulmonic valve was normal in structure. Pulmonic valve regurgitation is not visualized. No evidence of pulmonic stenosis. Aorta: The aortic root is normal in size and structure. Venous: The inferior vena cava is normal in size with greater than 50% respiratory variability, suggesting right atrial pressure of 3 mmHg. IAS/Shunts: No atrial level shunt detected by color flow Doppler.  LEFT VENTRICLE PLAX 2D LVIDd:         4.70 cm  Diastology LVIDs:         3.40 cm  LV e' medial:    7.18 cm/s LV PW:         1.10 cm  LV E/e' medial:  10.4 LV IVS:        1.20 cm  LV e' lateral:   8.70 cm/s LVOT diam:     1.90 cm  LV E/e' lateral: 8.6 LV SV:         56 LV SV Index:   26 LVOT Area:     2.84 cm  RIGHT VENTRICLE RV Basal diam:  3.10 cm RV S prime:     14.80 cm/s TAPSE (M-mode): 2.6 cm LEFT ATRIUM             Index       RIGHT ATRIUM           Index LA diam:        4.00 cm 1.85 cm/m  RA Area:     12.20 cm LA Vol (A2C):   80.3 ml 37.12 ml/m RA Volume:   25.00 ml  11.56 ml/m LA Vol (A4C):   52.2 ml  24.13 ml/m LA Biplane Vol: 68.7 ml 31.76 ml/m  AORTIC VALVE AV Area (Vmax):    2.17 cm AV Area (Vmean):   2.19 cm AV Area (VTI):     2.16 cm AV Vmax:           137.00 cm/s AV Vmean:          98.500 cm/s AV VTI:  0.258 m AV Peak Grad:      7.5 mmHg AV Mean Grad:      4.0 mmHg LVOT Vmax:         105.00 cm/s LVOT Vmean:        76.100 cm/s LVOT VTI:          0.197 m LVOT/AV VTI ratio: 0.76  AORTA Ao Root diam: 3.60 cm Ao Asc diam:  3.20 cm MITRAL VALVE MV Area (PHT): 2.62 cm    SHUNTS MV Area VTI:   1.76 cm    Systemic VTI:  0.20 m MV Peak grad:  4.1 mmHg    Systemic Diam: 1.90 cm MV Mean grad:  1.0 mmHg MV Vmax:       1.01 m/s MV Vmean:      54.6 cm/s MV Decel Time: 289 msec MV E velocity: 74.80 cm/s MV A velocity: 71.00 cm/s MV E/A ratio:  1.05 Charlton Haws MD Electronically signed by Charlton Haws MD Signature Date/Time: 11/13/2020/4:27:20 PM    Final    CT HEAD CODE STROKE WO CONTRAST  Result Date: 11/12/2020 CLINICAL DATA:  Code stroke.  Left facial droop and numbness EXAM: CT HEAD WITHOUT CONTRAST TECHNIQUE: Contiguous axial images were obtained from the base of the skull through the vertex without intravenous contrast. COMPARISON:  None. FINDINGS: Brain: There is no acute intracranial hemorrhage, mass effect, or edema. Gray-white differentiation is preserved. Patchy hypoattenuation in the supratentorial white matter is nonspecific but may reflect mild to moderate chronic microvascular ischemic changes. There is disproportionate prominence of the ventricles compared to the sulci. No extra-axial fluid collection. Vascular: No hyperdense vessel. Skull: Unremarkable. Sinuses/Orbits: No acute abnormality. Other: Head mastoid air cells are clear. ASPECTS ([place] Stroke Program Early CT Score) - Ganglionic level infarction (caudate, lentiform nuclei, internal capsule, insula, M1-M3 cortex): 7 - Supraganglionic infarction (M4-M6 cortex): 3 Total score (0-10 with 10 being normal): 10 IMPRESSION: There  is no acute intracranial hemorrhage or evidence of acute infarction. ASPECT score is 10. Disproportionate prominence of the ventricles. May reflect central cerebral volume loss or communicating (normal pressure) hydrocephalus in the appropriate setting. Mild to moderate chronic microvascular ischemic changes. Preliminary results were communicated to Dr. Selina Cooley at 6:46 pm on 11/12/2020 by text page via the [facility] messaging system. Electronically Signed   By: Guadlupe Spanish M.D.   On: 11/12/2020 18:48   VAS US CAROTID  Result Date: 11/13/2020 Carotid Arterial Duplex Study Patient Name:  DESHAUN WEISINGER  Date of Exam:   11/13/2020 Medical Rec #: [id number]     Accession #:    [id number] Date of Birth: Jul 03, 1960     Patient Gender: M Patient Age:   59Y Exam Location:  [facility] Procedure:      VAS US CAROTID Referring Phys: [id number] JARED M GARDNER --------------------------------------------------------------------------------  Indications:       Numbness and Weakness. Risk Factors:      Hypertension. Limitations        Today's exam was limited due to patient movement/talking. Comparison Study:  No prior study Performing Technologist: Sherren Kerns RVS  Examination Guidelines: A complete evaluation includes B-mode imaging, spectral Doppler, color Doppler, and power Doppler as needed of all accessible portions of each vessel. Bilateral testing is considered an integral part of a complete examination. Limited examinations for reoccurring indications may be performed as noted.  Right Carotid Findings: +----------+--------+--------+--------+------------------+------------------+           PSV cm/sEDV cm/sStenosisPlaque DescriptionComments           +----------+--------+--------+--------+------------------+------------------+  CCA Prox  100     22                                intimal thickening +----------+--------+--------+--------+------------------+------------------+ CCA Distal107     19                                 intimal thickening +----------+--------+--------+--------+------------------+------------------+ ICA Prox  88      22                                                   +----------+--------+--------+--------+------------------+------------------+ ICA Distal92      27                                                   +----------+--------+--------+--------+------------------+------------------+ ECA       112     16                                                   +----------+--------+--------+--------+------------------+------------------+ +----------+--------+-------+--------------+-------------------+           PSV cm/sEDV cmsDescribe      Arm Pressure (mmHG) +----------+--------+-------+--------------+-------------------+ Subclavian               Not identified                    +----------+--------+-------+--------------+-------------------+ +---------+--------+--+--------+--+---------+ VertebralPSV cm/s81EDV cm/s27Antegrade +---------+--------+--+--------+--+---------+  Left Carotid Findings: +----------+--------+--------+--------+------------------+------------------+           PSV cm/sEDV cm/sStenosisPlaque DescriptionComments           +----------+--------+--------+--------+------------------+------------------+ CCA Prox  104     25                                intimal thickening +----------+--------+--------+--------+------------------+------------------+ CCA Distal95      22                                intimal thickening +----------+--------+--------+--------+------------------+------------------+ ICA Prox  86      20                                                   +----------+--------+--------+--------+------------------+------------------+ ICA Distal153     39                                                   +----------+--------+--------+--------+------------------+------------------+ ECA       123      20                                                   +----------+--------+--------+--------+------------------+------------------+ +----------+--------+--------+--------+-------------------+  PSV cm/sEDV cm/sDescribeArm Pressure (mmHG) +----------+--------+--------+--------+-------------------+ [id number]                                          +----------+--------+--------+--------+-------------------+ +---------+--------+--+--------+--+---------+ VertebralPSV cm/s51EDV cm/s20Antegrade +---------+--------+--+--------+--+---------+   Summary: Right Carotid: The extracranial vessels were near-normal with only minimal wall                thickening or plaque. Left Carotid: The extracranial vessels were near-normal with only minimal wall               thickening or plaque. Vertebrals:  Bilateral vertebral arteries demonstrate antegrade flow. Subclavians: Normal flow hemodynamics were seen in bilateral subclavian              arteries. *See table(s) above for measurements and observations.     Preliminary         Scheduled Meds:  amLODipine  5 mg Oral Daily   atorvastatin  40 mg Oral Daily   clopidogrel  75 mg Oral Daily   enoxaparin (LOVENOX) injection  40 mg Subcutaneous Q24H   fenofibrate  160 mg Oral Daily   hydrocortisone cream  1 application Topical Daily   LORazepam  1 mg Intravenous Once   pantoprazole  20 mg Oral Daily   sodium chloride flush  3 mL Intravenous Once   Continuous Infusions:   LOS: 0 days    Time spent: 30 mins.More than 50% of that time was spent in counseling and/or coordination of care.      Silvano Bilis, MD [facility] P6/13/2022, 8:07 AM

## 2020-11-14 NOTE — Progress Notes (Signed)
At 1619  Discharge instructions reviewed with pt.  Copy of instructions given to pt, informed pt of new scripts sent to his pharmacy. Pt's dinner here, pt will eat first, then be discharged.   At 1723 Pt d/c'd via wheelchair with belongings.          Escorted by unit NT.

## 2020-11-14 NOTE — Progress Notes (Signed)
Stroke Neurology Progress Note  INTERVAL HISTORY   No family at bedside.  Patient lying in bed, still stated some numbness feeling on the left face.   MEDICATIONS      Current Facility-Administered Medications (Cardiovascular):    amLODipine (NORVASC) tablet 5 mg   atorvastatin (LIPITOR) tablet 80 mg   fenofibrate tablet 54 mg  Current Outpatient Medications (Cardiovascular):    amLODipine (NORVASC) 10 MG tablet, Take 1 tablet (10 mg total) by mouth daily.   [START ON 11/15/2020] atorvastatin (LIPITOR) 80 MG tablet, Take 1 tablet (80 mg total) by mouth daily.   fenofibrate 54 MG tablet, Take 1 tablet (54 mg total) by mouth daily.  Current Facility-Administered Medications (Respiratory):    albuterol (PROVENTIL) (2.5 MG/3ML) 0.083% nebulizer solution 3 mL   Current Facility-Administered Medications (Analgesics):    acetaminophen (TYLENOL) tablet 1,000 mg   Current Facility-Administered Medications (Hematological):    clopidogrel (PLAVIX) tablet 75 mg   enoxaparin (LOVENOX) injection 40 mg  Current Outpatient Medications (Hematological):    [START ON 11/15/2020] clopidogrel (PLAVIX) 75 MG tablet, Take 1 tablet (75 mg total) by mouth daily.  Current Facility-Administered Medications (Other):    hydrocortisone cream 1 % 1 application   LORazepam (ATIVAN) injection 1 mg   pantoprazole (PROTONIX) EC tablet 20 mg   sodium chloride flush (NS) 0.9 % injection 3 mL  VITAL SIGNS  Temp:  [97.2 F (36.2 C)-98.2 F (36.8 C)] 97.6 F (36.4 C) (06/13 1432) Pulse Rate:  [66-75] 67 (06/13 1432) Resp:  [16-20] 17 (06/13 1432) BP: (140-178)/(82-100) 166/88 (06/13 1432) SpO2:  [97 %-98 %] 97 % (06/13 1432)  PHYSICAL EXAM   General Physical Exam  General: NAD, lying comfortably in bed HENT: Normal oropharynx and mucosa. Normal external appearance of ears and nose. CV/Chest: No JVD, normal S1S2 Lungs: No audible wheezing. Normal work of breathing. No accessory muscle use Abdomen:  Non distended, non tender Extremities: Warm and well perfused. No appreciable edema, cyanosis or deformity. Skin: No rash. Normal palpation of skin.   Musculoskeletal: No joint tenderness. Normal digits and nails by inspection. No clubbing.  Bilateral hand/fingers deformity  Neurologic Examination   Mental status/Cognition: alert; oriented to month and age; good attention; no apparent neglect Speech/language: fluent; comprehension intact; object naming intact; repetition intact Visual Fields are full. Pupils are equal, round, and reactive to light. EOMI without ptosis or diploplia.  Facial sensation is subjectively decreased on the left Facial movement is symmetric.  Hearing is intact to voice. Uvula midline and palate elevates symmetrically. Shoulder shrug is symmetric. Tongue is midline without atrophy or fasciculations.  Tone is normal. Bulk is normal. 5/5 strength was present in all four extremities. Sensation is symmetric to light touch and temperature in the arms and legs. Deep Tendon Reflexes: 2+ and symmetric in the biceps and patellae. Toes are downgoing bilaterally. FTN are intact bilaterally, but has mild b/l action and postural tremor. Rapid alternating movements are normal without dysdiadochokinesia  IMAGING/DIAGNOSTIC STUDIES   CT head: There is no acute intracranial hemorrhage or evidence of acute infarction. ASPECT score is 10.  MRI brain: 1. No acute intracranial abnormality. 2. Prominence of the ventricular system at the upper portion of cortical sulcation. While this finding could be related to atrophy, possible NPH could also have this appearance. 3. Patchy T2/FLAIR hyperintensity involving the periventricular and deep white matter of both cerebral hemispheres. Finding could reflect changes of chronic microvascular ischemic disease or possibly transependymal flow of CSF (or a combination thereof).  MRA brain: Normal intracranial MRA. No large vessel occlusion,  hemodynamically significant stenosis, or other acute vascular abnormality.  Lab Results  Component Value Date   HGBA1C 6.0 (H) 11/13/2020   Lab Results  Component Value Date   LDLCALC UNABLE TO CALCULATE IF TRIGLYCERIDE OVER 400 mg/dL 73/41/9379  Results for Richard, Vaughan (MRN [id number]) as of 11/13/2020 16:17  Ref. Range 11/13/2020 03:59  Direct LDL Latest Ref Range: 0 - 99 mg/dL 29.9     ASSESSMENT AND PLAN  Richard Vaughan is a 60 y.o. male hx HTN who presents with transient left sided weakness now back to baseline.   TIA:  right brain TIA secondary to uncontrolled risk factors CT no acute finding MRI no acute infarct MRA unremarkable Carotid Doppler unremarkable 2D Echo EF 60 to 65% LDL 60.3 TG 509 -> 899 HgbA1c 6.0 Lovenox for VTE prophylaxis No antithrombotic prior to admission, now on clopidogrel 75 mg daily, continue on discharge.  Patient has allergy to aspirin. Patient counseled to be compliant with his antithrombotic medications Ongoing aggressive stroke risk factor management Therapy recommendations: None Disposition: Home today  Hypertension Stable Gradually normalized within 2-3 days. Long term BP goal normotensive  Hyperlipidemia Home meds: None LDL 60.3, goal < 70 TG 509->899 Now on fenofibrate and Lipitor 40 Continue statin and fenofibrate at discharge  Other Stroke Risk Factors Former cigarette smoker, advised to stop smoking   Other Active Problems Bilateral hand arthritis with deformity Chronic pain syndrome  Hospital day # 0  Neurology will sign off. Please call with questions. Pt will follow up with stroke clinic NP at [facility] in about 4 weeks. Thanks for the consult.   Marvel Plan, MD PhD Stroke Neurology 11/14/2020 3:05 PM    To contact Stroke Continuity provider, please refer to [email or web address]. After hours, contact General Neurology

## 2020-11-14 NOTE — Care Management (Signed)
Discussed PCP at length with patient. Patient states [facility] set him up with Dr Synetta Fail, however he does not want to go to Dr Algis Downs. Richard Vaughan.    Discussed finding another provider, NCM can call. Patient would like to find his own PCP.   Discussed patient can call insurance and be provided a list of providers in network. Also if patient knows of a provider he can call the providers office directly and see if provider is in network and accepting new patients.

## 2020-11-14 NOTE — Discharge Summary (Signed)
Richard Vaughan ION:[id number] DOB: [birth year] DOA: 11/12/2020  PCP: Pcp, No  Admit date: 11/12/2020 Discharge date: 11/14/2020  Time spent: 35 minutes  Recommendations for Outpatient Follow-up:  PCP f/u 2 weeks for blood pressure [facility] f/u 1 month     Discharge Diagnoses:  Principal Problem:   Acute ischemic stroke [facility]) Active Problems:   HTN (hypertension)   Discharge Condition: fair  Diet recommendation: heart healthy   Filed Weights   11/12/20 1831  Weight: 100.6 kg    History of present illness:  Patient is a 60 year old male with history of hypertension, psoriasis/psoriatic arthritis who presents to the emergency department with complaints of left-sided numbness/weakness mainly in the face, arm, leg.  On presentation he was hypertensive.  CT head did not show any acute intracranial abnormalities.  Patient was admitted for the suspicion of stroke/TIA.  Neurology consulted.  MRI of the brain did not show any acute stroke.  Pending stroke work-up including echo.  Hospital Course:T IA: Presented with left-sided numbness/weakness.  Currently the symptoms have resolved.  Symptoms might be consistent with TIA. CT head did not show any acute intracranial abnormalities so as MRI.  MRA did not show any large vessel occlusion or significant stenosis.  Carotid Doppler did not show any significant stenosis. TTE w/ mild LVH, o/w wnl. Pt does have longstanding untreated hypertension. PT/OT did not recommend any follow-up.   Hemoglobin A1c is pending. Neurology recommended to start on Plavix 75 mg daily since he is allergic to aspirin. - cont atorva/plavix - [facility] f/u   Hypertension: Med rec does not show any medications that he was taking at home for hypertension.  Patient is noncompliant and has not seen a physician for a while..  bp moderately elevated - started on amlodipine 10 mg daily. - close pcp f/u  Hypertriglyceridemia Fibrate started. Fasting lipids 899     Procedures: none   Consultations: neurology  Discharge Exam: Vitals:   11/14/20 1209 11/14/20 1432  BP: (!) 173/100 (!) 166/88  Pulse: 69 67  Resp: 16 17  Temp: 97.7 F (36.5 C) 97.6 F (36.4 C)  SpO2: 97% 97%    General exam: Appears calm and comfortable ,Not in distress, obese HEENT:PERRL,Oral mucosa moist, Ear/Nose normal on gross exam Respiratory system: Bilateral equal air entry, normal vesicular breath sounds, no wheezes or crackles Cardiovascular system: S1 & S2 heard, RRR. No JVD, murmurs, rubs, gallops or clicks. No pedal edema. Gastrointestinal system: Abdomen is nondistended, soft and nontender. No organomegaly or masses felt. Normal bowel sounds heard. Central nervous system: Alert and oriented. No focal neurological deficits. Extremities: No edema, no clubbing ,no cyanosis Deformities of the fingers from psoriatic arthritis Skin: Psoriatic rash, no ulcers  Discharge Instructions   Discharge Instructions     Ambulatory referral to Neurology   Complete by: As directed    Follow up with stroke clinic NP (Jessica Vanschaick or Darrol Angel, if both not available, consider Manson Allan, or Ahern) at [facility] in about 4 weeks. Thanks.   Diet - low sodium heart healthy   Complete by: As directed    Increase activity slowly   Complete by: As directed       Allergies as of 11/14/2020       Reactions   Aspirin Anaphylaxis   Etanercept Anaphylaxis   Nsaids Anaphylaxis   Colchicine Nausea And Vomiting   Doxycycline Nausea And Vomiting, Rash, Other (See Comments)   Causes petechiae   Methotrexate Other (See Comments)   Vomiting, diarrhea,  nausea    Morphine Other (See Comments)   Just doesn't work   Sulfamethoxazole-trimethoprim Swelling   Hand swelling        Medication List     TAKE these medications    acetaminophen 500 MG tablet Commonly known as: TYLENOL Take 1,000 mg by mouth every 6 (six) hours as needed for headache (pain).   albuterol  108 (90 Base) MCG/ACT inhaler Commonly known as: VENTOLIN HFA Inhale 2 puffs into the lungs every 4 (four) hours as needed.   amLODipine 10 MG tablet Commonly known as: NORVASC Take 1 tablet (10 mg total) by mouth daily.   atorvastatin 80 MG tablet Commonly known as: LIPITOR Take 1 tablet (80 mg total) by mouth daily. Start taking on: November 15, 2020   clopidogrel 75 MG tablet Commonly known as: PLAVIX Take 1 tablet (75 mg total) by mouth daily. Start taking on: November 15, 2020   fenofibrate 54 MG tablet Take 1 tablet (54 mg total) by mouth daily.   hydrocortisone 2.5 % cream Apply 1 application topically daily.   lansoprazole 15 MG capsule Commonly known as: PREVACID Take 30 mg by mouth daily before breakfast.       Allergies  Allergen Reactions   Aspirin Anaphylaxis   Etanercept Anaphylaxis   Nsaids Anaphylaxis   Colchicine Nausea And Vomiting   Doxycycline Nausea And Vomiting, Rash and Other (See Comments)    Causes petechiae   Methotrexate Other (See Comments)    Vomiting, diarrhea, nausea    Morphine Other (See Comments)    Just doesn't work   Sulfamethoxazole-Trimethoprim Swelling    Hand swelling     Follow-up Information     [facility]. Schedule an appointment as soon as possible for a visit in 1 month(s).   Specialty: Neurology Contact information: [address] [place] [address] [phone]        Your primary care provider Follow up.   Why: 1-2 weeks                 The results of significant diagnostics from this hospitalization (including imaging, microbiology, ancillary and laboratory) are listed below for reference.    Significant Diagnostic Studies: MR ANGIO HEAD WO CONTRAST  Result Date: 11/13/2020 CLINICAL DATA:  Initial evaluation for acute neuro deficit, stroke suspected. EXAM: MRI HEAD WITHOUT CONTRAST MRA HEAD WITHOUT CONTRAST TECHNIQUE: Multiplanar, multi-echo pulse  sequences of the brain and surrounding structures were acquired without intravenous contrast. Angiographic images of the Circle of Willis were acquired using MRA technique without intravenous contrast. COMPARISON: No pertinent prior exam. COMPARISON:  Prior head CT from 11/12/2020. FINDINGS: MRI HEAD FINDINGS Brain: Cerebral volume within normal limits for age. Patchy T2/FLAIR hyperintensity involving the periventricular deep white matter both cerebral hemispheres could reflect chronic microvascular ischemic disease or possibly transependymal flow of CSF (or a combination there of). No abnormal foci of restricted diffusion to suggest acute or subacute ischemia. Gray-white matter differentiation maintained. No encephalomalacia suggest chronic cortical infarction. No evidence for acute or chronic intracranial hemorrhage. No mass lesion, midline shift or mass effect. Ventricles are somewhat prominent in at upper portion of cortical sulcation. While this finding could be related to atrophy, possible in pH could also have this appearance. Pituitary gland and suprasellar region within normal limits. Midline structures intact. No extra-axial collection. Vascular: Major intracranial vascular flow voids are maintained. Skull and upper cervical spine: Craniocervical junction within normal limits. Bone marrow signal intensity normal. No scalp soft tissue  abnormality. Sinuses/Orbits: Globes and orbital soft tissues demonstrate no acute finding. Paranasal sinuses are largely clear. No significant mastoid effusion. Inner ear structures grossly normal. Other: None. MRA HEAD FINDINGS Anterior circulation: Visualized distal cervical segments of the internal carotid arteries are patent with antegrade flow. Petrous, cavernous, and supraclinoid segments patent without stenosis or other abnormality. A1 segments patent bilaterally. Right A1 hypoplastic. Normal anterior communicating artery complex. Anterior cerebral arteries patent to  their distal aspects without stenosis. No M1 stenosis or occlusion. Normal MCA bifurcations. Distal MCA branches well perfused and symmetric. Posterior circulation: Visualized portions of the distal V4 segments are widely patent. Neither PICA origin visualized. Basilar patent to its distal aspect without stenosis. Superior cerebellar arteries patent bilaterally. Both PCAs primarily supplied via the basilar well perfused to their distal aspects. No intracranial aneurysm. Anatomic variants: Hypoplastic right A1. IMPRESSION: MRI HEAD IMPRESSION: 1. No acute intracranial abnormality. 2. Prominence of the ventricular system at the upper portion of cortical sulcation. While this finding could be related to atrophy, possible NPH could also have this appearance. 3. Patchy T2/FLAIR hyperintensity involving the periventricular and deep white matter of both cerebral hemispheres. Finding could reflect changes of chronic microvascular ischemic disease or possibly transependymal flow of CSF (or a combination thereof). MRA HEAD IMPRESSION: Normal intracranial MRA. No large vessel occlusion, hemodynamically significant stenosis, or other acute vascular abnormality. Electronically Signed   By: Rise MuBenjamin  McClintock M.D.   On: 11/13/2020 03:17   MR BRAIN WO CONTRAST  Result Date: 11/13/2020 CLINICAL DATA:  Initial evaluation for acute neuro deficit, stroke suspected. EXAM: MRI HEAD WITHOUT CONTRAST MRA HEAD WITHOUT CONTRAST TECHNIQUE: Multiplanar, multi-echo pulse sequences of the brain and surrounding structures were acquired without intravenous contrast. Angiographic images of the Circle of Willis were acquired using MRA technique without intravenous contrast. COMPARISON: No pertinent prior exam. COMPARISON:  Prior head CT from 11/12/2020. FINDINGS: MRI HEAD FINDINGS Brain: Cerebral volume within normal limits for age. Patchy T2/FLAIR hyperintensity involving the periventricular deep white matter both cerebral hemispheres could  reflect chronic microvascular ischemic disease or possibly transependymal flow of CSF (or a combination there of). No abnormal foci of restricted diffusion to suggest acute or subacute ischemia. Gray-white matter differentiation maintained. No encephalomalacia suggest chronic cortical infarction. No evidence for acute or chronic intracranial hemorrhage. No mass lesion, midline shift or mass effect. Ventricles are somewhat prominent in at upper portion of cortical sulcation. While this finding could be related to atrophy, possible in pH could also have this appearance. Pituitary gland and suprasellar region within normal limits. Midline structures intact. No extra-axial collection. Vascular: Major intracranial vascular flow voids are maintained. Skull and upper cervical spine: Craniocervical junction within normal limits. Bone marrow signal intensity normal. No scalp soft tissue abnormality. Sinuses/Orbits: Globes and orbital soft tissues demonstrate no acute finding. Paranasal sinuses are largely clear. No significant mastoid effusion. Inner ear structures grossly normal. Other: None. MRA HEAD FINDINGS Anterior circulation: Visualized distal cervical segments of the internal carotid arteries are patent with antegrade flow. Petrous, cavernous, and supraclinoid segments patent without stenosis or other abnormality. A1 segments patent bilaterally. Right A1 hypoplastic. Normal anterior communicating artery complex. Anterior cerebral arteries patent to their distal aspects without stenosis. No M1 stenosis or occlusion. Normal MCA bifurcations. Distal MCA branches well perfused and symmetric. Posterior circulation: Visualized portions of the distal V4 segments are widely patent. Neither PICA origin visualized. Basilar patent to its distal aspect without stenosis. Superior cerebellar arteries patent bilaterally. Both PCAs primarily supplied via the basilar well  perfused to their distal aspects. No intracranial aneurysm.  Anatomic variants: Hypoplastic right A1. IMPRESSION: MRI HEAD IMPRESSION: 1. No acute intracranial abnormality. 2. Prominence of the ventricular system at the upper portion of cortical sulcation. While this finding could be related to atrophy, possible NPH could also have this appearance. 3. Patchy T2/FLAIR hyperintensity involving the periventricular and deep white matter of both cerebral hemispheres. Finding could reflect changes of chronic microvascular ischemic disease or possibly transependymal flow of CSF (or a combination thereof). MRA HEAD IMPRESSION: Normal intracranial MRA. No large vessel occlusion, hemodynamically significant stenosis, or other acute vascular abnormality. Electronically Signed   By: Rise Mu M.D.   On: 11/13/2020 03:17   ECHOCARDIOGRAM COMPLETE  Result Date: 11/13/2020    ECHOCARDIOGRAM REPORT   Patient Name:   Richard Vaughan Date of Exam: 11/13/2020 Medical Rec #:  [id number]    Height: Accession #:    [id number]   Weight:       221.8 lb Date of Birth:  1960/07/27    BSA:          2.163 m Patient Age:    59 years     BP:           178/85 mmHg Patient Gender: M            HR:           62 bpm. Exam Location:  Inpatient Procedure: 2D Echo, Cardiac Doppler and Color Doppler Indications:    Stroke  History:        Patient has no prior history of Echocardiogram examinations.                 Risk Factors:Hypertension.  Sonographer:    Ross Ludwig RDCS (AE) Referring Phys: [phone] JARED M GARDNER  Sonographer Comments: Suboptimal subcostal window. IMPRESSIONS  1. Left ventricular ejection fraction, by estimation, is 60 to 65%. The left ventricle has normal function. The left ventricle has no regional wall motion abnormalities. There is mild left ventricular hypertrophy. Left ventricular diastolic parameters were normal.  2. Right ventricular systolic function is normal. The right ventricular size is normal.  3. Left atrial size was mildly dilated.  4. The mitral valve is normal in  structure. Trivial mitral valve regurgitation. No evidence of mitral stenosis.  5. The aortic valve is tricuspid. Aortic valve regurgitation is not visualized. No aortic stenosis is present.  6. The inferior vena cava is normal in size with greater than 50% respiratory variability, suggesting right atrial pressure of 3 mmHg. FINDINGS  Left Ventricle: Left ventricular ejection fraction, by estimation, is 60 to 65%. The left ventricle has normal function. The left ventricle has no regional wall motion abnormalities. The left ventricular internal cavity size was normal in size. There is  mild left ventricular hypertrophy. Left ventricular diastolic parameters were normal. Right Ventricle: The right ventricular size is normal. No increase in right ventricular wall thickness. Right ventricular systolic function is normal. Left Atrium: Left atrial size was mildly dilated. Right Atrium: Right atrial size was normal in size. Pericardium: There is no evidence of pericardial effusion. Mitral Valve: The mitral valve is normal in structure. There is mild thickening of the mitral valve leaflet(s). Trivial mitral valve regurgitation. No evidence of mitral valve stenosis. MV peak gradient, 4.1 mmHg. The mean mitral valve gradient is 1.0 mmHg. Tricuspid Valve: The tricuspid valve is normal in structure. Tricuspid valve regurgitation is trivial. No evidence of tricuspid stenosis. Aortic Valve: The aortic valve is tricuspid. Aortic  valve regurgitation is not visualized. No aortic stenosis is present. Aortic valve mean gradient measures 4.0 mmHg. Aortic valve peak gradient measures 7.5 mmHg. Aortic valve area, by VTI measures 2.16 cm. Pulmonic Valve: The pulmonic valve was normal in structure. Pulmonic valve regurgitation is not visualized. No evidence of pulmonic stenosis. Aorta: The aortic root is normal in size and structure. Venous: The inferior vena cava is normal in size with greater than 50% respiratory variability, suggesting  right atrial pressure of 3 mmHg. IAS/Shunts: No atrial level shunt detected by color flow Doppler.  LEFT VENTRICLE PLAX 2D LVIDd:         4.70 cm  Diastology LVIDs:         3.40 cm  LV e' medial:    7.18 cm/s LV PW:         1.10 cm  LV E/e' medial:  10.4 LV IVS:        1.20 cm  LV e' lateral:   8.70 cm/s LVOT diam:     1.90 cm  LV E/e' lateral: 8.6 LV SV:         56 LV SV Index:   26 LVOT Area:     2.84 cm  RIGHT VENTRICLE RV Basal diam:  3.10 cm RV S prime:     14.80 cm/s TAPSE (M-mode): 2.6 cm LEFT ATRIUM             Index       RIGHT ATRIUM           Index LA diam:        4.00 cm 1.85 cm/m  RA Area:     12.20 cm LA Vol (A2C):   80.3 ml 37.12 ml/m RA Volume:   25.00 ml  11.56 ml/m LA Vol (A4C):   52.2 ml 24.13 ml/m LA Biplane Vol: 68.7 ml 31.76 ml/m  AORTIC VALVE AV Area (Vmax):    2.17 cm AV Area (Vmean):   2.19 cm AV Area (VTI):     2.16 cm AV Vmax:           137.00 cm/s AV Vmean:          98.500 cm/s AV VTI:            0.258 m AV Peak Grad:      7.5 mmHg AV Mean Grad:      4.0 mmHg LVOT Vmax:         105.00 cm/s LVOT Vmean:        76.100 cm/s LVOT VTI:          0.197 m LVOT/AV VTI ratio: 0.76  AORTA Ao Root diam: 3.60 cm Ao Asc diam:  3.20 cm MITRAL VALVE MV Area (PHT): 2.62 cm    SHUNTS MV Area VTI:   1.76 cm    Systemic VTI:  0.20 m MV Peak grad:  4.1 mmHg    Systemic Diam: 1.90 cm MV Mean grad:  1.0 mmHg MV Vmax:       1.01 m/s MV Vmean:      54.6 cm/s MV Decel Time: 289 msec MV E velocity: 74.80 cm/s MV A velocity: 71.00 cm/s MV E/A ratio:  1.05 Charlton Haws MD Electronically signed by Charlton Haws MD Signature Date/Time: 11/13/2020/4:27:20 PM    Final    CT HEAD CODE STROKE WO CONTRAST  Result Date: 11/12/2020 CLINICAL DATA:  Code stroke.  Left facial droop and numbness EXAM: CT HEAD WITHOUT CONTRAST TECHNIQUE: Contiguous axial images were obtained from the  base of the skull through the vertex without intravenous contrast. COMPARISON:  None. FINDINGS: Brain: There is no acute intracranial  hemorrhage, mass effect, or edema. Gray-white differentiation is preserved. Patchy hypoattenuation in the supratentorial white matter is nonspecific but may reflect mild to moderate chronic microvascular ischemic changes. There is disproportionate prominence of the ventricles compared to the sulci. No extra-axial fluid collection. Vascular: No hyperdense vessel. Skull: Unremarkable. Sinuses/Orbits: No acute abnormality. Other: Head mastoid air cells are clear. ASPECTS (Alberta Stroke Program Early CT Score) - Ganglionic level infarction (caudate, lentiform nuclei, internal capsule, insula, M1-M3 cortex): 7 - Supraganglionic infarction (M4-M6 cortex): 3 Total score (0-10 with 10 being normal): 10 IMPRESSION: There is no acute intracranial hemorrhage or evidence of acute infarction. ASPECT score is 10. Disproportionate prominence of the ventricles. May reflect central cerebral volume loss or communicating (normal pressure) hydrocephalus in the appropriate setting. Mild to moderate chronic microvascular ischemic changes. Preliminary results were communicated to Dr. Selina Cooley at 6:46 pm on 11/12/2020 by text page via the [facility] messaging system. Electronically Signed   By: Guadlupe Spanish M.D.   On: 11/12/2020 18:48   VAS US CAROTID  Result Date: 11/13/2020 Carotid Arterial Duplex Study Patient Name:  Richard Vaughan  Date of Exam:   11/13/2020 Medical Rec #: [id number]     Accession #:    [id number] Date of Birth: March 03, 1961     Patient Gender: M Patient Age:   59Y Exam Location:  [facility] Procedure:      VAS US CAROTID Referring Phys: [id number] JARED M GARDNER --------------------------------------------------------------------------------  Indications:       Numbness and Weakness. Risk Factors:      Hypertension. Limitations        Today's exam was limited due to patient movement/talking. Comparison Study:  No prior study Performing Technologist: Sherren Kerns RVS  Examination Guidelines: A complete evaluation  includes B-mode imaging, spectral Doppler, color Doppler, and power Doppler as needed of all accessible portions of each vessel. Bilateral testing is considered an integral part of a complete examination. Limited examinations for reoccurring indications may be performed as noted.  Right Carotid Findings: +----------+--------+--------+--------+------------------+------------------+           PSV cm/sEDV cm/sStenosisPlaque DescriptionComments           +----------+--------+--------+--------+------------------+------------------+ CCA Prox  100     22                                intimal thickening +----------+--------+--------+--------+------------------+------------------+ CCA Distal107     19                                intimal thickening +----------+--------+--------+--------+------------------+------------------+ ICA Prox  88      22                                                   +----------+--------+--------+--------+------------------+------------------+ ICA Distal92      27                                                   +----------+--------+--------+--------+------------------+------------------+ ECA  112     16                                                   +----------+--------+--------+--------+------------------+------------------+ +----------+--------+-------+--------------+-------------------+           PSV cm/sEDV cmsDescribe      Arm Pressure (mmHG) +----------+--------+-------+--------------+-------------------+ Subclavian               Not identified                    +----------+--------+-------+--------------+-------------------+ +---------+--------+--+--------+--+---------+ VertebralPSV cm/s81EDV cm/s27Antegrade +---------+--------+--+--------+--+---------+  Left Carotid Findings: +----------+--------+--------+--------+------------------+------------------+           PSV cm/sEDV cm/sStenosisPlaque  DescriptionComments           +----------+--------+--------+--------+------------------+------------------+ CCA Prox  104     25                                intimal thickening +----------+--------+--------+--------+------------------+------------------+ CCA Distal95      22                                intimal thickening +----------+--------+--------+--------+------------------+------------------+ ICA Prox  86      20                                                   +----------+--------+--------+--------+------------------+------------------+ ICA Distal153     39                                                   +----------+--------+--------+--------+------------------+------------------+ ECA       123     20                                                   +----------+--------+--------+--------+------------------+------------------+ +----------+--------+--------+--------+-------------------+           PSV cm/sEDV cm/sDescribeArm Pressure (mmHG) +----------+--------+--------+--------+-------------------+ [id number]                                          +----------+--------+--------+--------+-------------------+ +---------+--------+--+--------+--+---------+ VertebralPSV cm/s51EDV cm/s20Antegrade +---------+--------+--+--------+--+---------+   Summary: Right Carotid: The extracranial vessels were near-normal with only minimal wall                thickening or plaque. Left Carotid: The extracranial vessels were near-normal with only minimal wall               thickening or plaque. Vertebrals:  Bilateral vertebral arteries demonstrate antegrade flow. Subclavians: Normal flow hemodynamics were seen in bilateral subclavian              arteries. *See table(s) above for measurements and observations.     Preliminary     Microbiology: Recent Results (from the past 240  hour(s))  SARS CORONAVIRUS 2 (TAT 6-24 HRS) Nasopharyngeal Nasopharyngeal Swab      Status: None   Collection Time: 11/13/20 12:04 AM   Specimen: Nasopharyngeal Swab  Result Value Ref Range Status   SARS Coronavirus 2 NEGATIVE NEGATIVE Final    Comment: (NOTE) SARS-CoV-2 target nucleic acids are NOT DETECTED.  The SARS-CoV-2 RNA is generally detectable in upper and lower respiratory specimens during the acute phase of infection. Negative results do not preclude SARS-CoV-2 infection, do not rule out co-infections with other pathogens, and should not be used as the sole basis for treatment or other patient management decisions. Negative results must be combined with clinical observations, patient history, and epidemiological information. The expected result is Negative.  Fact Sheet for Patients: HairSlick.no  Fact Sheet for Healthcare Providers: quierodirigir.com  This test is not yet approved or cleared by the [place] FDA and  has been authorized for detection and/or diagnosis of SARS-CoV-2 by FDA under an Emergency Use Authorization (EUA). This EUA will remain  in effect (meaning this test can be used) for the duration of the COVID-19 declaration under Se ction 564(b)(1) of the Act, 21 U.S.C. section 360bbb-3(b)(1), unless the authorization is terminated or revoked sooner.  Performed at [facility], [address], [place], [place] [address]      Labs: Basic Metabolic Panel: Recent Labs  Lab 11/12/20 1837 11/12/20 1850  NA 134* 136  K 3.7 3.8  CL 102 104  CO2 21*  --   GLUCOSE 128* 130*  BUN 5* 6*  CREATININE 0.89 0.80  CALCIUM 8.7*  --    Liver Function Tests: Recent Labs  Lab 11/12/20 1837  AST 25  ALT 21  ALKPHOS 83  BILITOT 0.3  PROT 7.8  ALBUMIN 3.5   No results for input(s): LIPASE, AMYLASE in the last 168 hours. No results for input(s): AMMONIA in the last 168 hours. CBC: Recent Labs  Lab 11/12/20 1837 11/12/20 1850  WBC 9.1  --   NEUTROABS 5.3  --   HGB  12.5* 12.9*  HCT 39.3 38.0*  MCV 84.9  --   PLT 348  --    Cardiac Enzymes: No results for input(s): CKTOTAL, CKMB, CKMBINDEX, TROPONINI in the last 168 hours. BNP: BNP (last 3 results) No results for input(s): BNP in the last 8760 hours.  ProBNP (last 3 results) No results for input(s): PROBNP in the last 8760 hours.  CBG: Recent Labs  Lab 11/12/20 1832  GLUCAP 121*       Signed:  Silvano Bilis MD.  [facility] 11/14/2020, 2:43 PM

## 2021-01-12 ENCOUNTER — Other Ambulatory Visit: Payer: Self-pay | Admitting: Obstetrics and Gynecology
